# Patient Record
Sex: Female | Born: 1940
Health system: Southern US, Community
[De-identification: ages and names within clinical notes are randomized; demographics above are authoritative.]

## PROBLEM LIST (undated history)

## (undated) DIAGNOSIS — R7303 Prediabetes: Secondary | ICD-10-CM

## (undated) DIAGNOSIS — M1990 Unspecified osteoarthritis, unspecified site: Secondary | ICD-10-CM

## (undated) DIAGNOSIS — I341 Nonrheumatic mitral (valve) prolapse: Secondary | ICD-10-CM

## (undated) DIAGNOSIS — I201 Angina pectoris with documented spasm: Secondary | ICD-10-CM

## (undated) DIAGNOSIS — M797 Fibromyalgia: Secondary | ICD-10-CM

## (undated) DIAGNOSIS — K219 Gastro-esophageal reflux disease without esophagitis: Secondary | ICD-10-CM

## (undated) DIAGNOSIS — R21 Rash and other nonspecific skin eruption: Secondary | ICD-10-CM

## (undated) DIAGNOSIS — H409 Unspecified glaucoma: Secondary | ICD-10-CM

## (undated) HISTORY — PX: SHOULDER ARTHROSCOPY W/ ROTATOR CUFF REPAIR: SHX2400

## (undated) HISTORY — DX: Angina pectoris with documented spasm: I20.1

---

## 1973-03-17 HISTORY — PX: ABDOMINAL HYSTERECTOMY: SHX81

## 1998-03-17 HISTORY — PX: CARDIAC CATHETERIZATION: SHX172

## 1999-01-23 ENCOUNTER — Ambulatory Visit (HOSPITAL_COMMUNITY): Admission: RE | Admit: 1999-01-23 | Discharge: 1999-01-23 | Payer: Self-pay | Admitting: *Deleted

## 2001-03-19 ENCOUNTER — Encounter: Payer: Self-pay | Admitting: Family Medicine

## 2001-03-19 ENCOUNTER — Encounter: Admission: RE | Admit: 2001-03-19 | Discharge: 2001-03-19 | Payer: Self-pay | Admitting: Family Medicine

## 2002-01-14 ENCOUNTER — Ambulatory Visit (HOSPITAL_BASED_OUTPATIENT_CLINIC_OR_DEPARTMENT_OTHER): Admission: RE | Admit: 2002-01-14 | Discharge: 2002-01-14 | Payer: Self-pay | Admitting: Family Medicine

## 2002-09-14 ENCOUNTER — Encounter: Admission: RE | Admit: 2002-09-14 | Discharge: 2002-09-28 | Payer: Self-pay | Admitting: Family Medicine

## 2003-03-18 DIAGNOSIS — I201 Angina pectoris with documented spasm: Secondary | ICD-10-CM

## 2003-03-18 HISTORY — DX: Angina pectoris with documented spasm: I20.1

## 2003-04-18 ENCOUNTER — Inpatient Hospital Stay (HOSPITAL_COMMUNITY): Admission: EM | Admit: 2003-04-18 | Discharge: 2003-04-19 | Payer: Self-pay | Admitting: Emergency Medicine

## 2003-09-01 ENCOUNTER — Encounter: Admission: RE | Admit: 2003-09-01 | Discharge: 2003-09-01 | Payer: Self-pay | Admitting: Orthopedic Surgery

## 2003-09-04 ENCOUNTER — Ambulatory Visit (HOSPITAL_COMMUNITY): Admission: RE | Admit: 2003-09-04 | Discharge: 2003-09-04 | Payer: Self-pay | Admitting: Orthopedic Surgery

## 2003-09-04 ENCOUNTER — Ambulatory Visit (HOSPITAL_BASED_OUTPATIENT_CLINIC_OR_DEPARTMENT_OTHER): Admission: RE | Admit: 2003-09-04 | Discharge: 2003-09-04 | Payer: Self-pay | Admitting: Orthopedic Surgery

## 2006-04-03 ENCOUNTER — Encounter: Admission: RE | Admit: 2006-04-03 | Discharge: 2006-04-03 | Payer: Self-pay | Admitting: *Deleted

## 2008-04-25 ENCOUNTER — Encounter: Admission: RE | Admit: 2008-04-25 | Discharge: 2008-04-25 | Payer: Self-pay | Admitting: Family Medicine

## 2010-04-06 ENCOUNTER — Encounter: Payer: Self-pay | Admitting: Neurological Surgery

## 2010-08-02 NOTE — Discharge Summary (Signed)
NAME:  Patty [name], Patty [name]                         ACCOUNT NO.:  [id number]   MEDICAL RECORD NO.:  [id number]                   PATIENT TYPE:  INP   LOCATION:  3733                                 FACILITY:  [facility]   PHYSICIAN:  Meade Maw, M.D.                 DATE OF BIRTH:  Mar 03, 1941   DATE OF ADMISSION:  04/18/2003  DATE OF DISCHARGE:  04/19/2003                                 DISCHARGE SUMMARY   ADMISSION DIAGNOSES:  1. Chest pain, rule out myocardial infarction.  2. History of right coronary artery spasm.  3. Normal coronaries by cardiac catheterization in 2000.   DISCHARGE DIAGNOSES:  1. Chest pain, negative for myocardial infarction.  2. History of right coronary artery spasm in November of 2000.   PROCEDURES:  Stress Cardiolite on April 19, 2003.   COMPLICATIONS:  None.   DISCHARGE STATUS:  Stable.  Improved.   ADMISSION HISTORY:  Please see completed H&P for details but in short, this  is a 70 year old female who has a history of RCA ostial spasm during cardiac  catheterization, January 23, 1999.  She had no coronary disease by cath.  LV  function was normal with an EF of 77%.  She presented to the emergency room  complaining of right-sided chest pain that woke her from sleep on the  morning of admission around 5 a.m.  She described it as a sharp stinging  sensation and states it was similar to the pain she had experienced  previously before her cardiac catheterization in 2000.  She states, however,  it was somewhat more intense.  She denied any other associated symptoms.  She stated that it only lasted a couple of seconds but had been essentially  intermittent throughout the day on the day of admission.  She went to her  PCP's office and she was given 1 sublingual nitroglycerin, started on O2 and  states her pain was relieved and has not recurred.   PHYSICAL EXAMINATION ON ADMISSION:  Please see complete H&P, but in short,  vital signs were stable.  She was  afebrile.  Physical exam on admission was  essentially normal without any abnormalities.   LABORATORY AND ACCESSORY CLINICAL DATA:  Chest x-ray showed no active  disease.   EKG showed a normal sinus rhythm with a T wave inversion in lead III only,  which was unchanged since her last tracing in 2001.  There were some  nonspecific anterior T wave inversions in the leads V1 through 4 with  biphasic T's and somewhat nonspecific.   Admissions labs were all normal including CBC and coagulation studies and  cardiac markers.   HOSPITAL COURSE:  She was admitted to rule out MI with serial enzymes.  Repeat EKG.  She will be continued on her aspirin.  Lovenox subcu as well as  nitroglycerin paste were also started.  She was scheduled for a stress  Cardiolite the following  morning.  Her calcium channel blocker will be  continued, with her history of RCA spasm.   Repeat cardiac enzymes were all normal.  Her physical exam was unchanged.  She actually refused her Lovenox injection secondary to not quite  understanding its purpose.  She essentially had no further chest discomfort  except for some burning to the general anterior chest wall.  Repeat  cardiac enzymes were normal.  She was scheduled for a stress Cardiolite  later that day.   Stress Cardiolite study was done on April 19, 2003 without incident.  She  had no complaint of chest pain.  Normal blood pressure response.  She had 1-  mm anterior ST depression with exercise that resolved at 3 minutes'  recovery.  There were no other EKG changes.  The results of the Cardiolite  showed no evidence of ischemia and an EF was 76%.  The patient was  discharged home later that day without incident.   DISCHARGE MEDICATIONS:  1. Cartia XT 180 mg daily.  2. Aspirin 325 mg daily.  3. Prozac 40 mg at h.s.  4. Wellbutrin 150 mg daily.  5. Premarin 1.25 mg daily.  6. Prilosec 20 mg daily.  7. Calcium with vitamin D b.i.d.   DIET:  She was  instructed to maintain a low-salt, low-caffeine diet.   FOLLOWUP:  She is to see her regular physician for followup as needed.  She  may see Dr. Meade Maw again on an as-needed basis or at the discretion  of her PCP.      Adrian Saran, N.P.                        Meade Maw, M.D.    HB/MEDQ  D:  05/17/2003  T:  05/18/2003  Job:  [id number]   cc:   Dellis Anes. Idell Pickles, M.D.  [address]  [place]  [place] [address]  Fax: [phone]   Meade Maw, M.D.  [address]  [place]  [place] [address]  Fax: [phone]

## 2010-08-02 NOTE — Cardiovascular Report (Signed)
[place]  Patient:    Patty [name]                         MRN: [id number] Proc. Date: 01/23/99 Adm. Date:  [id number] Attending:  Meade Maw A CC:         Cardiac Catheterization Laboratory                        Cardiac Catheterization  PROCEDURE PERFORMED:  Left heart catheterization, coronary angiography, single plane ventriculogram, intercoronary IV nitroglycerin.  CARDIOLOGIST:  Meade Maw, M.D.  INDICATIONS:  Chest pain with positive stress test.  DESCRIPTION OF PROCEDURE:  After obtaining a written informed consent, the patient was brought to the cardiac catheterization laboratory in a postabsorptive state. Preoperative sedation was achieved using IV Versed, IV fentanyl.  The right femoral head was identified using radiographic technique.  The right groin was prepped nd draped in the usual sterile fashion.  Local anesthesia was achieved using 1% Xylocaine.  A 6-French hemostasis sheath was placed into the right femoral artery using a modified Seldinger technique.  Selective coronary angiography was performed using a JL4 and JR4 Judkins catheter.  Nonionic contrast was used and was hand injected per the coronaries.  There was significant ostial spasm of the right coronary artery.  The JR4 was exchanged to a No-Torque right.  Intercoronary nitroglycerin was repeated.  A single plane ventriculogram was performed in the RAO position using a 6-French pigtail curved catheter.  Nonionic contrast was used nd was power-injected.  Upon review of the films, there was no identifiable lesions to intervene upon.  The hemostasis sheath was removed.  Hemostasis was achieved using digital pressure.  Of note, the patient required a significant amount of sedation.  FINDINGS: Aortic pressure:  140/60. LV pressure:  139/23.  SINGLE PLANE VENTRICULOGRAM:  Revealed normal wall motion.  No mitral regurgitation noted.  CORONARY  ANGIOGRAPHY: 1. Left main coronary artery:  Bifurcated into the left anterior descending    and circumflex vessel.  There was no significant disease in the left main    coronary artery. 2. Left anterior descending coronary artery:  The left anterior descending    gave rise to a large D-I which was bifurcating, a small D-II, and ended as    an epicardial recurrent branch.  There was no significant disease in the    LAD. 3. Circumflex vessel:  The circumflex vessel gave rise to a small OM-I,    moderate OM-II, moderate OM-III, and ended as a small AV groove vessel.    There was no significant disease in the circumflex vessel. 4. Right coronary artery:  Was dominant per the posterior circulation.  There    was significant ostial spasm with engagement of the right coronary artery.    This improved with intercoronary nitroglycerin.  There were two moderate    sized RV marginals, a large PDA, and two PL branches.  There was no    significant disease in the right coronary artery.  IMPRESSION:  Significant ostial spasm of the right coronary artery.  RECOMMENDATIONS:  Cardizem CD 180 mg q.d. and tobacco cessation. DD:  01/23/99 TD:  01/23/99 Job: [id number]

## 2010-08-02 NOTE — Op Note (Signed)
NAME:  Patty [name], Patty [name]                         ACCOUNT NO.:  [id number]   MEDICAL RECORD NO.:  [id number]                   PATIENT TYPE:  AMB   LOCATION:  DSC                                  FACILITY:  [facility]   PHYSICIAN:  Robert [name]. Thurston Hole, M.D.              DATE OF BIRTH:  03/15/1941   DATE OF PROCEDURE:  09/04/2003  DATE OF DISCHARGE:                                 OPERATIVE REPORT   PREOPERATIVE DIAGNOSIS:  Left shoulder rotator cuff tendonitis with  impingement with acromioclavicular joint spurring.   POSTOPERATIVE DIAGNOSIS:  1. Left shoulder partial rotator cuff tear.  2. Left shoulder partial labral tear.  3. Left shoulder impingement.  4. Left shoulder acromioclavicular joint spurring and degenerative joint     disease.   PROCEDURE:  1. Left shoulder examination under anesthesia followed by arthroscopic     debridement of partial rotator cuff tear and partial labral tear. 2.     Left shoulder subacromial decompression.  2. Left shoulder distal clavicle excision.   SURGEON:  Elana Alm. Thurston Hole, M.D.   ASSISTANT:  Julien Girt, P.[name].   ANESTHESIA:  General.   OPERATIVE TIME:  40 minutes.   COMPLICATIONS:  None.   INDICATIONS FOR PROCEDURE:  [name] is [name] 70 year old woman who has had  significant left shoulder pain for the past nine months increasing in nature  with exam and MRI documenting rotator cuff tendonitis and impingement with  [facility] joint spurring who has failed conservative care and is now to undergo  arthroscopy.   DESCRIPTION OF PROCEDURE:  [name] is brought to the operating room on  September 04, 2003, after an interscalene block had been placed in the holding  room by anesthesia.  She was placed on the operating table in supine  position.  Her left shoulder was examined under anesthesia.  She had full  range of motion and her shoulder was stable ligamentous exam.  She was then  placed in [name] beach chair position and her shoulder and arm were  prepped using  sterile DuraPrep and draped using sterile technique.  Originally, through [name]  posterior arthroscopic portal, the arthroscope with [name] pump attached was  placed and through an anterior portal, an arthroscopic probe was placed.  On  initial inspection, the articular cartilage in the glenohumeral joint showed  mild grade 1-2 chondromalacia.  The anterior labrum partial tearing of 25%  was debrided.  The superior labrum and biceps tendon anchor was intact.  The  biceps tendon was intact.  The inferior labrum and anterior inferior  glenohumeral ligament complex was intact.  The posterior labrum was intact.  The rotator cuff showed [name] partial tear 30% of the under surface of the  supraspinatus which was debrided.  The rest of the rotator cuff was intact.  The inferior capsular recess was free of pathology.  The subacromial space  showed [name] large amount of bursitis  which  was resected.  The rotator cuff was  somewhat inflamed and thickened on the bursal side but no evidence of [name]  tear.  Impingement was noted and [name] subacromial decompression was carried out  as well as [name] CA ligament release removing 6-8 mm of the under surface of the  anterior, anterolateral, and anteromedial acromion.  The [facility] joint showed  significant spurring and degenerative changes and the distal 5-6 mm of  clavicle was resected.  After this was done, the shoulder could be brought  through [name] [name] full range of motion with no impingement on the rotator cuff.  At this point, it was felt that all the pathology had been satisfactorily  addressed.  The instruments were removed.  The portals were closed with 3-0  nylon sutures.  Sterile dressings and [name] sling were applied.  The patient was  awakened and taken to the recovery room in stable condition.   FOLLOW UP CARE:  [name] will be followed as an outpatient on Vicodin and  Naprosyn with early physical therapy.  See her back in the office in [name] week  for sutures out and  follow up.                                               Robert [name]. Thurston Hole, M.D.    RAW/MEDQ  D:  09/04/2003  T:  09/04/2003  Job:  [id number]

## 2010-09-10 ENCOUNTER — Other Ambulatory Visit: Payer: Self-pay | Admitting: Family Medicine

## 2010-09-10 DIAGNOSIS — N644 Mastodynia: Secondary | ICD-10-CM

## 2010-09-17 ENCOUNTER — Ambulatory Visit
Admission: RE | Admit: 2010-09-17 | Discharge: 2010-09-17 | Disposition: A | Discharge status: Home / Self Care | Payer: Medicare Other | Source: Ambulatory Visit | Attending: Family Medicine | Admitting: Family Medicine

## 2010-09-17 DIAGNOSIS — N644 Mastodynia: Secondary | ICD-10-CM

## 2012-01-12 ENCOUNTER — Other Ambulatory Visit: Payer: Self-pay | Admitting: Family Medicine

## 2012-01-12 DIAGNOSIS — Z1231 Encounter for screening mammogram for malignant neoplasm of breast: Secondary | ICD-10-CM

## 2012-02-09 ENCOUNTER — Ambulatory Visit
Admission: RE | Admit: 2012-02-09 | Discharge: 2012-02-09 | Disposition: A | Discharge status: Home / Self Care | Payer: Medicare PPO | Source: Ambulatory Visit | Attending: Family Medicine | Admitting: Family Medicine

## 2012-02-09 DIAGNOSIS — Z1231 Encounter for screening mammogram for malignant neoplasm of breast: Secondary | ICD-10-CM

## 2014-03-30 ENCOUNTER — Other Ambulatory Visit: Payer: Self-pay | Admitting: Family Medicine

## 2014-03-30 DIAGNOSIS — E669 Obesity, unspecified: Secondary | ICD-10-CM | POA: Diagnosis not present

## 2014-03-30 DIAGNOSIS — K219 Gastro-esophageal reflux disease without esophagitis: Secondary | ICD-10-CM | POA: Diagnosis not present

## 2014-03-30 DIAGNOSIS — Z713 Dietary counseling and surveillance: Secondary | ICD-10-CM | POA: Diagnosis not present

## 2014-03-30 DIAGNOSIS — G4700 Insomnia, unspecified: Secondary | ICD-10-CM | POA: Diagnosis not present

## 2014-03-30 DIAGNOSIS — M8580 Other specified disorders of bone density and structure, unspecified site: Secondary | ICD-10-CM

## 2014-03-30 DIAGNOSIS — Z1231 Encounter for screening mammogram for malignant neoplasm of breast: Secondary | ICD-10-CM

## 2014-03-30 DIAGNOSIS — I201 Angina pectoris with documented spasm: Secondary | ICD-10-CM | POA: Diagnosis not present

## 2014-04-11 DIAGNOSIS — L299 Pruritus, unspecified: Secondary | ICD-10-CM | POA: Diagnosis not present

## 2014-04-11 DIAGNOSIS — B079 Viral wart, unspecified: Secondary | ICD-10-CM | POA: Diagnosis not present

## 2014-04-11 DIAGNOSIS — L928 Other granulomatous disorders of the skin and subcutaneous tissue: Secondary | ICD-10-CM | POA: Diagnosis not present

## 2014-04-11 DIAGNOSIS — G4700 Insomnia, unspecified: Secondary | ICD-10-CM | POA: Diagnosis not present

## 2014-04-25 ENCOUNTER — Ambulatory Visit
Admission: RE | Admit: 2014-04-25 | Discharge: 2014-04-25 | Disposition: A | Discharge status: Home / Self Care | Payer: Commercial Managed Care - HMO | Source: Ambulatory Visit | Attending: Family Medicine | Admitting: Family Medicine

## 2014-04-25 DIAGNOSIS — Z780 Asymptomatic menopausal state: Secondary | ICD-10-CM | POA: Diagnosis not present

## 2014-04-25 DIAGNOSIS — Z1231 Encounter for screening mammogram for malignant neoplasm of breast: Secondary | ICD-10-CM | POA: Diagnosis not present

## 2014-04-25 DIAGNOSIS — M8580 Other specified disorders of bone density and structure, unspecified site: Secondary | ICD-10-CM

## 2014-04-25 DIAGNOSIS — M85852 Other specified disorders of bone density and structure, left thigh: Secondary | ICD-10-CM | POA: Diagnosis not present

## 2014-04-25 DIAGNOSIS — M8588 Other specified disorders of bone density and structure, other site: Secondary | ICD-10-CM | POA: Diagnosis not present

## 2014-07-31 DIAGNOSIS — R21 Rash and other nonspecific skin eruption: Secondary | ICD-10-CM | POA: Diagnosis not present

## 2014-08-22 DIAGNOSIS — R21 Rash and other nonspecific skin eruption: Secondary | ICD-10-CM | POA: Diagnosis not present

## 2014-10-11 DIAGNOSIS — L562 Photocontact dermatitis [berloque dermatitis]: Secondary | ICD-10-CM | POA: Diagnosis not present

## 2014-11-07 DIAGNOSIS — Z0100 Encounter for examination of eyes and vision without abnormal findings: Secondary | ICD-10-CM | POA: Diagnosis not present

## 2014-11-24 DIAGNOSIS — T148 Other injury of unspecified body region: Secondary | ICD-10-CM | POA: Diagnosis not present

## 2014-11-24 DIAGNOSIS — J069 Acute upper respiratory infection, unspecified: Secondary | ICD-10-CM | POA: Diagnosis not present

## 2014-11-24 DIAGNOSIS — J029 Acute pharyngitis, unspecified: Secondary | ICD-10-CM | POA: Diagnosis not present

## 2014-12-26 DIAGNOSIS — H40023 Open angle with borderline findings, high risk, bilateral: Secondary | ICD-10-CM | POA: Diagnosis not present

## 2014-12-26 DIAGNOSIS — H527 Unspecified disorder of refraction: Secondary | ICD-10-CM | POA: Diagnosis not present

## 2014-12-26 DIAGNOSIS — H25813 Combined forms of age-related cataract, bilateral: Secondary | ICD-10-CM | POA: Diagnosis not present

## 2014-12-26 DIAGNOSIS — H5213 Myopia, bilateral: Secondary | ICD-10-CM | POA: Diagnosis not present

## 2015-02-28 DIAGNOSIS — M5412 Radiculopathy, cervical region: Secondary | ICD-10-CM | POA: Diagnosis not present

## 2015-02-28 DIAGNOSIS — M79673 Pain in unspecified foot: Secondary | ICD-10-CM | POA: Diagnosis not present

## 2015-04-03 DIAGNOSIS — K625 Hemorrhage of anus and rectum: Secondary | ICD-10-CM | POA: Diagnosis not present

## 2015-04-03 DIAGNOSIS — R42 Dizziness and giddiness: Secondary | ICD-10-CM | POA: Diagnosis not present

## 2015-04-03 DIAGNOSIS — K648 Other hemorrhoids: Secondary | ICD-10-CM | POA: Diagnosis not present

## 2015-05-10 DIAGNOSIS — D72829 Elevated white blood cell count, unspecified: Secondary | ICD-10-CM | POA: Diagnosis not present

## 2015-05-10 DIAGNOSIS — M25511 Pain in right shoulder: Secondary | ICD-10-CM | POA: Diagnosis not present

## 2015-05-23 DIAGNOSIS — M67911 Unspecified disorder of synovium and tendon, right shoulder: Secondary | ICD-10-CM | POA: Diagnosis not present

## 2015-05-23 DIAGNOSIS — M25511 Pain in right shoulder: Secondary | ICD-10-CM | POA: Diagnosis not present

## 2015-05-23 DIAGNOSIS — M24811 Other specific joint derangements of right shoulder, not elsewhere classified: Secondary | ICD-10-CM | POA: Diagnosis not present

## 2015-06-06 ENCOUNTER — Ambulatory Visit: Payer: Commercial Managed Care - HMO | Admitting: Oncology

## 2015-06-13 DIAGNOSIS — M67911 Unspecified disorder of synovium and tendon, right shoulder: Secondary | ICD-10-CM | POA: Diagnosis not present

## 2015-06-25 ENCOUNTER — Other Ambulatory Visit: Payer: Self-pay | Admitting: Orthopedic Surgery

## 2015-06-25 DIAGNOSIS — M67911 Unspecified disorder of synovium and tendon, right shoulder: Secondary | ICD-10-CM

## 2015-06-26 DIAGNOSIS — R509 Fever, unspecified: Secondary | ICD-10-CM | POA: Diagnosis not present

## 2015-06-26 DIAGNOSIS — R5383 Other fatigue: Secondary | ICD-10-CM | POA: Diagnosis not present

## 2015-06-26 DIAGNOSIS — R109 Unspecified abdominal pain: Secondary | ICD-10-CM | POA: Diagnosis not present

## 2015-06-27 DIAGNOSIS — H40023 Open angle with borderline findings, high risk, bilateral: Secondary | ICD-10-CM | POA: Diagnosis not present

## 2015-06-30 ENCOUNTER — Other Ambulatory Visit: Payer: Commercial Managed Care - HMO

## 2015-07-11 ENCOUNTER — Ambulatory Visit
Admission: RE | Admit: 2015-07-11 | Discharge: 2015-07-11 | Disposition: A | Discharge status: Home / Self Care | Payer: Commercial Managed Care - HMO | Source: Ambulatory Visit | Attending: Orthopedic Surgery | Admitting: Orthopedic Surgery

## 2015-07-11 DIAGNOSIS — M67911 Unspecified disorder of synovium and tendon, right shoulder: Secondary | ICD-10-CM

## 2015-07-11 DIAGNOSIS — M75111 Incomplete rotator cuff tear or rupture of right shoulder, not specified as traumatic: Secondary | ICD-10-CM | POA: Diagnosis not present

## 2015-07-13 DIAGNOSIS — M75121 Complete rotator cuff tear or rupture of right shoulder, not specified as traumatic: Secondary | ICD-10-CM | POA: Diagnosis not present

## 2015-09-25 ENCOUNTER — Other Ambulatory Visit: Payer: Self-pay | Admitting: Orthopedic Surgery

## 2015-10-05 DIAGNOSIS — R1013 Epigastric pain: Secondary | ICD-10-CM | POA: Diagnosis not present

## 2015-10-05 DIAGNOSIS — M25569 Pain in unspecified knee: Secondary | ICD-10-CM | POA: Diagnosis not present

## 2015-10-05 DIAGNOSIS — R110 Nausea: Secondary | ICD-10-CM | POA: Diagnosis not present

## 2015-10-05 DIAGNOSIS — M791 Myalgia: Secondary | ICD-10-CM | POA: Diagnosis not present

## 2015-10-05 DIAGNOSIS — E162 Hypoglycemia, unspecified: Secondary | ICD-10-CM | POA: Diagnosis not present

## 2015-10-08 ENCOUNTER — Other Ambulatory Visit: Payer: Self-pay | Admitting: Physician Assistant

## 2015-10-08 DIAGNOSIS — R1013 Epigastric pain: Secondary | ICD-10-CM

## 2015-10-08 DIAGNOSIS — R110 Nausea: Secondary | ICD-10-CM

## 2015-10-16 ENCOUNTER — Encounter (HOSPITAL_BASED_OUTPATIENT_CLINIC_OR_DEPARTMENT_OTHER): Payer: Self-pay | Admitting: *Deleted

## 2015-10-16 DIAGNOSIS — L309 Dermatitis, unspecified: Secondary | ICD-10-CM | POA: Diagnosis not present

## 2015-10-17 ENCOUNTER — Encounter (HOSPITAL_BASED_OUTPATIENT_CLINIC_OR_DEPARTMENT_OTHER)
Admission: RE | Admit: 2015-10-17 | Discharge: 2015-10-17 | Disposition: A | Discharge status: Home / Self Care | Payer: Commercial Managed Care - HMO | Source: Ambulatory Visit | Attending: Orthopedic Surgery | Admitting: Orthopedic Surgery

## 2015-10-17 DIAGNOSIS — Z01810 Encounter for preprocedural cardiovascular examination: Secondary | ICD-10-CM | POA: Insufficient documentation

## 2015-10-17 DIAGNOSIS — R9431 Abnormal electrocardiogram [ECG] [EKG]: Secondary | ICD-10-CM | POA: Insufficient documentation

## 2015-10-21 DIAGNOSIS — H00011 Hordeolum externum right upper eyelid: Secondary | ICD-10-CM | POA: Diagnosis not present

## 2015-10-22 ENCOUNTER — Ambulatory Visit (HOSPITAL_BASED_OUTPATIENT_CLINIC_OR_DEPARTMENT_OTHER)
Admission: RE | Admit: 2015-10-22 | Discharge: 2015-10-22 | Disposition: A | Discharge status: Home / Self Care | Payer: Commercial Managed Care - HMO | Source: Ambulatory Visit | Attending: Orthopedic Surgery | Admitting: Orthopedic Surgery

## 2015-10-22 ENCOUNTER — Ambulatory Visit (HOSPITAL_BASED_OUTPATIENT_CLINIC_OR_DEPARTMENT_OTHER): Payer: Commercial Managed Care - HMO | Admitting: Anesthesiology

## 2015-10-22 ENCOUNTER — Encounter (HOSPITAL_BASED_OUTPATIENT_CLINIC_OR_DEPARTMENT_OTHER): Payer: Self-pay | Admitting: Anesthesiology

## 2015-10-22 ENCOUNTER — Encounter (HOSPITAL_BASED_OUTPATIENT_CLINIC_OR_DEPARTMENT_OTHER)
Admission: RE | Disposition: A | Discharge status: Home / Self Care | Payer: Self-pay | Source: Ambulatory Visit | Attending: Orthopedic Surgery

## 2015-10-22 DIAGNOSIS — R7303 Prediabetes: Secondary | ICD-10-CM | POA: Insufficient documentation

## 2015-10-22 DIAGNOSIS — M75121 Complete rotator cuff tear or rupture of right shoulder, not specified as traumatic: Secondary | ICD-10-CM | POA: Diagnosis not present

## 2015-10-22 DIAGNOSIS — Z79890 Hormone replacement therapy (postmenopausal): Secondary | ICD-10-CM | POA: Insufficient documentation

## 2015-10-22 DIAGNOSIS — E669 Obesity, unspecified: Secondary | ICD-10-CM | POA: Diagnosis not present

## 2015-10-22 DIAGNOSIS — S46111A Strain of muscle, fascia and tendon of long head of biceps, right arm, initial encounter: Secondary | ICD-10-CM | POA: Diagnosis not present

## 2015-10-22 DIAGNOSIS — M19011 Primary osteoarthritis, right shoulder: Secondary | ICD-10-CM | POA: Diagnosis not present

## 2015-10-22 DIAGNOSIS — M75101 Unspecified rotator cuff tear or rupture of right shoulder, not specified as traumatic: Secondary | ICD-10-CM | POA: Diagnosis not present

## 2015-10-22 DIAGNOSIS — Z6833 Body mass index (BMI) 33.0-33.9, adult: Secondary | ICD-10-CM | POA: Diagnosis not present

## 2015-10-22 DIAGNOSIS — I2510 Atherosclerotic heart disease of native coronary artery without angina pectoris: Secondary | ICD-10-CM | POA: Diagnosis not present

## 2015-10-22 DIAGNOSIS — X58XXXA Exposure to other specified factors, initial encounter: Secondary | ICD-10-CM | POA: Diagnosis not present

## 2015-10-22 DIAGNOSIS — K219 Gastro-esophageal reflux disease without esophagitis: Secondary | ICD-10-CM | POA: Insufficient documentation

## 2015-10-22 DIAGNOSIS — M66321 Spontaneous rupture of flexor tendons, right upper arm: Secondary | ICD-10-CM | POA: Diagnosis not present

## 2015-10-22 DIAGNOSIS — Z79899 Other long term (current) drug therapy: Secondary | ICD-10-CM | POA: Diagnosis not present

## 2015-10-22 DIAGNOSIS — G8918 Other acute postprocedural pain: Secondary | ICD-10-CM | POA: Diagnosis not present

## 2015-10-22 DIAGNOSIS — M25511 Pain in right shoulder: Secondary | ICD-10-CM | POA: Diagnosis not present

## 2015-10-22 HISTORY — DX: Unspecified osteoarthritis, unspecified site: M19.90

## 2015-10-22 HISTORY — DX: Rash and other nonspecific skin eruption: R21

## 2015-10-22 HISTORY — DX: Gastro-esophageal reflux disease without esophagitis: K21.9

## 2015-10-22 HISTORY — DX: Unspecified glaucoma: H40.9

## 2015-10-22 HISTORY — DX: Nonrheumatic mitral (valve) prolapse: I34.1

## 2015-10-22 HISTORY — DX: Fibromyalgia: M79.7

## 2015-10-22 HISTORY — DX: Prediabetes: R73.03

## 2015-10-22 SURGERY — SHOULDER ARTHROSCOPY WITH SUBACROMIAL DECOMPRESSION AND DISTAL CLAVICLE EXCISION
Anesthesia: General | Site: Shoulder | Laterality: Right

## 2015-10-22 MED ORDER — SCOPOLAMINE 1 MG/3DAYS TD PT72: 1.0000 | MEDICATED_PATCH | Freq: Once | TRANSDERMAL | Status: DC | PRN

## 2015-10-22 MED ORDER — MIDAZOLAM HCL 2 MG/2ML IJ SOLN
INTRAMUSCULAR | Status: AC
  Filled 2015-10-22: qty 2

## 2015-10-22 MED ORDER — CEFAZOLIN SODIUM-DEXTROSE 2-4 GM/100ML-% IV SOLN
2.0000 g | INTRAVENOUS | Status: AC
  Administered 2015-10-22: 2 g via INTRAVENOUS

## 2015-10-22 MED ORDER — FENTANYL CITRATE (PF) 100 MCG/2ML IJ SOLN
50.0000 ug | INTRAMUSCULAR | Status: AC | PRN
  Administered 2015-10-22 (×3): 50 ug via INTRAVENOUS

## 2015-10-22 MED ORDER — POVIDONE-IODINE 7.5 % EX SOLN: Freq: Once | CUTANEOUS | Status: DC

## 2015-10-22 MED ORDER — FENTANYL CITRATE (PF) 100 MCG/2ML IJ SOLN
INTRAMUSCULAR | Status: AC
  Filled 2015-10-22: qty 2

## 2015-10-22 MED ORDER — DOCUSATE SODIUM 100 MG PO CAPS: 100.0000 mg | ORAL_CAPSULE | Freq: Three times a day (TID) | ORAL | 0 refills | Status: DC | PRN

## 2015-10-22 MED ORDER — FENTANYL CITRATE (PF) 100 MCG/2ML IJ SOLN: 25.0000 ug | INTRAMUSCULAR | Status: DC | PRN

## 2015-10-22 MED ORDER — SODIUM CHLORIDE 0.9 % IR SOLN
Status: DC | PRN
  Administered 2015-10-22: 5000 mL

## 2015-10-22 MED ORDER — DEXAMETHASONE SODIUM PHOSPHATE 4 MG/ML IJ SOLN
INTRAMUSCULAR | Status: DC | PRN
  Administered 2015-10-22: 8 mg via INTRAVENOUS

## 2015-10-22 MED ORDER — LIDOCAINE HCL (CARDIAC) 20 MG/ML IV SOLN
INTRAVENOUS | Status: DC | PRN
  Administered 2015-10-22: 50 mg via INTRAVENOUS

## 2015-10-22 MED ORDER — PROPOFOL 10 MG/ML IV BOLUS
INTRAVENOUS | Status: DC | PRN
  Administered 2015-10-22: 15 mg via INTRAVENOUS

## 2015-10-22 MED ORDER — SUCCINYLCHOLINE CHLORIDE 20 MG/ML IJ SOLN
INTRAMUSCULAR | Status: DC | PRN
  Administered 2015-10-22: 140 mg via INTRAVENOUS

## 2015-10-22 MED ORDER — OXYCODONE-ACETAMINOPHEN 5-325 MG PO TABS: 1.0000 | ORAL_TABLET | ORAL | 0 refills | Status: DC | PRN

## 2015-10-22 MED ORDER — GLYCOPYRROLATE 0.2 MG/ML IJ SOLN: 0.2000 mg | Freq: Once | INTRAMUSCULAR | Status: DC | PRN

## 2015-10-22 MED ORDER — CEFAZOLIN SODIUM-DEXTROSE 2-4 GM/100ML-% IV SOLN
INTRAVENOUS | Status: AC
  Filled 2015-10-22: qty 100

## 2015-10-22 MED ORDER — MIDAZOLAM HCL 2 MG/2ML IJ SOLN
1.0000 mg | INTRAMUSCULAR | Status: DC | PRN
  Administered 2015-10-22 (×3): 1 mg via INTRAVENOUS

## 2015-10-22 MED ORDER — LACTATED RINGERS IV SOLN
INTRAVENOUS | Status: DC
  Administered 2015-10-22: 12:00:00 via INTRAVENOUS

## 2015-10-22 MED ORDER — PHENYLEPHRINE HCL 10 MG/ML IJ SOLN
INTRAVENOUS | Status: DC | PRN
  Administered 2015-10-22: 40 ug/min via INTRAVENOUS

## 2015-10-22 MED ORDER — ONDANSETRON HCL 4 MG/2ML IJ SOLN
INTRAMUSCULAR | Status: DC | PRN
  Administered 2015-10-22: 4 mg via INTRAVENOUS

## 2015-10-22 MED ORDER — PROMETHAZINE HCL 25 MG/ML IJ SOLN: 6.2500 mg | INTRAMUSCULAR | Status: DC | PRN

## 2015-10-22 SURGICAL SUPPLY — 85 items
APL SKNCLS STERI-STRIP NONHPOA (GAUZE/BANDAGES/DRESSINGS)
BENZOIN TINCTURE PRP APPL 2/3 (GAUZE/BANDAGES/DRESSINGS) IMPLANT
BLADE CLIPPER SURG (BLADE) IMPLANT
BLADE SURG 15 STRL LF DISP TIS (BLADE) IMPLANT
BLADE SURG 15 STRL SS (BLADE)
BUR OVAL 4.0 (BURR) ×4 IMPLANT
CANNULA 5.75X71 LONG (CANNULA) ×4 IMPLANT
CANNULA TWIST IN 8.25X7CM (CANNULA) IMPLANT
CHLORAPREP W/TINT 26ML (MISCELLANEOUS) ×4 IMPLANT
CLOSURE WOUND 1/2 X4 (GAUZE/BANDAGES/DRESSINGS)
DECANTER SPIKE VIAL GLASS SM (MISCELLANEOUS) IMPLANT
DRAPE IMP U-DRAPE 54X76 (DRAPES) ×4 IMPLANT
DRAPE INCISE IOBAN 66X45 STRL (DRAPES) ×4 IMPLANT
DRAPE STERI 35X30 U-POUCH (DRAPES) ×4 IMPLANT
DRAPE SURG 17X23 STRL (DRAPES) ×4 IMPLANT
DRAPE U-SHAPE 47X51 STRL (DRAPES) ×4 IMPLANT
DRAPE U-SHAPE 76X120 STRL (DRAPES) ×8 IMPLANT
DRSG PAD ABDOMINAL 8X10 ST (GAUZE/BANDAGES/DRESSINGS) ×4 IMPLANT
ELECT REM PT RETURN 9FT ADLT (ELECTROSURGICAL)
ELECTRODE REM PT RTRN 9FT ADLT (ELECTROSURGICAL) ×1 IMPLANT
GAUZE SPONGE 4X4 12PLY STRL (GAUZE/BANDAGES/DRESSINGS) ×4 IMPLANT
GAUZE SPONGE 4X4 16PLY XRAY LF (GAUZE/BANDAGES/DRESSINGS) IMPLANT
GAUZE XEROFORM 1X8 LF (GAUZE/BANDAGES/DRESSINGS) ×4 IMPLANT
GLOVE BIO SURGEON STRL SZ7 (GLOVE) ×4 IMPLANT
GLOVE BIO SURGEON STRL SZ7.5 (GLOVE) ×4 IMPLANT
GLOVE BIOGEL PI IND STRL 7.0 (GLOVE) ×4 IMPLANT
GLOVE BIOGEL PI IND STRL 8 (GLOVE) ×2 IMPLANT
GLOVE BIOGEL PI INDICATOR 7.0 (GLOVE) ×6
GLOVE BIOGEL PI INDICATOR 8 (GLOVE) ×2
GLOVE ECLIPSE 6.5 STRL STRAW (GLOVE) ×3 IMPLANT
GOWN STRL REUS W/ TWL LRG LVL3 (GOWN DISPOSABLE) ×4 IMPLANT
GOWN STRL REUS W/ TWL XL LVL3 (GOWN DISPOSABLE) ×2 IMPLANT
GOWN STRL REUS W/TWL LRG LVL3 (GOWN DISPOSABLE) ×8
GOWN STRL REUS W/TWL XL LVL3 (GOWN DISPOSABLE) ×4
IV NS IRRIG 3000ML ARTHROMATIC (IV SOLUTION) ×6 IMPLANT
LASSO CRESCENT QUICKPASS (SUTURE) IMPLANT
LIQUID BAND (GAUZE/BANDAGES/DRESSINGS) IMPLANT
MANIFOLD NEPTUNE II (INSTRUMENTS) ×4 IMPLANT
NDL 1/2 CIR CATGUT .05X1.09 (NEEDLE) IMPLANT
NDL SCORPION MULTI FIRE (NEEDLE) IMPLANT
NDL SUT 6 .5 CRC .975X.05 MAYO (NEEDLE) IMPLANT
NEEDLE 1/2 CIR CATGUT .05X1.09 (NEEDLE) IMPLANT
NEEDLE MAYO TAPER (NEEDLE)
NEEDLE SCORPION MULTI FIRE (NEEDLE) IMPLANT
NS IRRIG 1000ML POUR BTL (IV SOLUTION) IMPLANT
PACK ARTHROSCOPY DSU (CUSTOM PROCEDURE TRAY) ×4 IMPLANT
PACK BASIN DAY SURGERY FS (CUSTOM PROCEDURE TRAY) ×4 IMPLANT
PENCIL BUTTON HOLSTER BLD 10FT (ELECTRODE) IMPLANT
RESECTOR FULL RADIUS 4.2MM (BLADE) ×4 IMPLANT
SHEET MEDIUM DRAPE 40X70 STRL (DRAPES) IMPLANT
SLEEVE SCD COMPRESS KNEE MED (MISCELLANEOUS) ×4 IMPLANT
SLING ARM FOAM STRAP LRG (SOFTGOODS) ×3 IMPLANT
SLING ARM IMMOBILIZER MED (SOFTGOODS) IMPLANT
SLING ARM MED ADULT FOAM STRAP (SOFTGOODS) IMPLANT
SLING ARM XL FOAM STRAP (SOFTGOODS) IMPLANT
SPONGE LAP 4X18 X RAY DECT (DISPOSABLE) IMPLANT
STRIP CLOSURE SKIN 1/2X4 (GAUZE/BANDAGES/DRESSINGS) IMPLANT
SUCTION FRAZIER HANDLE 10FR (MISCELLANEOUS)
SUCTION TUBE FRAZIER 10FR DISP (MISCELLANEOUS) IMPLANT
SUPPORT WRAP ARM LG (MISCELLANEOUS) IMPLANT
SUT BONE WAX W31G (SUTURE) IMPLANT
SUT ETHIBOND 2 OS 4 DA (SUTURE) IMPLANT
SUT ETHILON 3 0 PS 1 (SUTURE) ×4 IMPLANT
SUT ETHILON 4 0 PS 2 18 (SUTURE) IMPLANT
SUT FIBERWIRE #2 38 T-5 BLUE (SUTURE)
SUT MNCRL AB 3-0 PS2 18 (SUTURE) IMPLANT
SUT MNCRL AB 4-0 PS2 18 (SUTURE) IMPLANT
SUT PDS AB 0 CT 36 (SUTURE) IMPLANT
SUT PROLENE 3 0 PS 2 (SUTURE) IMPLANT
SUT TIGER TAPE 7 IN WHITE (SUTURE) IMPLANT
SUT VIC AB 0 CT1 27 (SUTURE)
SUT VIC AB 0 CT1 27XBRD ANBCTR (SUTURE) IMPLANT
SUT VIC AB 2-0 SH 27 (SUTURE)
SUT VIC AB 2-0 SH 27XBRD (SUTURE) IMPLANT
SUTURE FIBERWR #2 38 T-5 BLUE (SUTURE) IMPLANT
SYR BULB 3OZ (MISCELLANEOUS) IMPLANT
TAPE FIBER 2MM 7IN #2 BLUE (SUTURE) IMPLANT
TOWEL OR 17X24 6PK STRL BLUE (TOWEL DISPOSABLE) ×4 IMPLANT
TOWEL OR NON WOVEN STRL DISP B (DISPOSABLE) ×4 IMPLANT
TUBE CONNECTING 20'X1/4 (TUBING) ×1
TUBE CONNECTING 20X1/4 (TUBING) ×3 IMPLANT
TUBING ARTHROSCOPY IRRIG 16FT (MISCELLANEOUS) ×4 IMPLANT
WAND STAR VAC 90 (SURGICAL WAND) ×4 IMPLANT
WATER STERILE IRR 1000ML POUR (IV SOLUTION) ×4 IMPLANT
YANKAUER SUCT BULB TIP NO VENT (SUCTIONS) IMPLANT

## 2015-10-22 NOTE — Op Note (Signed)
Procedure(s):   FALANA LANE female 75 y.o. 10/22/2015  Procedure(s) and Anesthesia Type: #1 right shoulder arthroscopic debridement of irreparable rotator cuff tear, proximal biceps tear #2 right shoulder arthroscopic subacromial decompression #3 right shoulder arthroscopic distal clavicle excision  Surgeon(s) and Role:    * Jones Broom, MD - Primary     Surgeon: Mable Paris   Assistants: Damita Lack PA-C (Danielle was present and scrubbed throughout the procedure and was essential in positioning, assisting with the camera and instrumentation,, and closure)  Anesthesia: General endotracheal anesthesia with preoperative interscalene block given by the attending anesthesiologist    Procedure Detail   Estimated Blood Loss: Min         Drains: none  Blood Given: none         Specimens: none        Complications:  * No complications entered in OR log *         Disposition: PACU - hemodynamically stable.         Condition: stable    Procedure:   INDICATIONS FOR SURGERY: The patient is 75 y.o. female who has had a long history of right shoulder pain which has been refractory to nonoperative management. She was found to have a chronic retracted rotator cuff tear with significant acromioclavicular arthropathy. Ultimately indicated for surgical treatment to try and decrease pain and restore function.  OPERATIVE FINDINGS: Examination under anesthesia: No stiffness or instability   DESCRIPTION OF PROCEDURE: The patient was identified in preoperative  holding area where I personally marked the operative site after  verifying site, side, and procedure with the patient. An interscalene block was given by the attending anesthesiologist the holding area.  The patient was taken back to the operating room where general anesthesia was induced without complication and was placed in the beach-chair position with the back  elevated about 60 degrees and all  extremities and head and neck carefully padded and  positioned.   The right upper extremity was then prepped and  draped in a standard sterile fashion. The appropriate time-out  procedure was carried out. The patient did receive IV antibiotics  within 30 minutes of incision.   A small posterior portal incision was made and the arthroscope was introduced into the joint. An anterior portal was then established above the subscapularis using needle localization. Small cannula was placed anteriorly. Diagnostic arthroscopy was then carried out.  She was noted to have retracted tears of the supraspinatus and subscapularis. On the glenoid humeral joint surfaces there was some chondromalacia with a small area of grade 4 exposed bone on the glenoid and a central area of grade 3 on ablation on the humeral head. This was lightly debrided with shaver. There was some tearing of the anterior and superior labrum with about 1.5 cm of residual proximal long head biceps tendon stump. This was all debrided extensively with the shaver. The undersurface of the rotator cuff was debrided.  The arthroscope was then introduced into the subacromial space a standard lateral portal was established with needle localization. There was some residual tendon remaining on the tuberosity but the torn portion of the supraspinatus was retracted laterally to the level of the glenoid. The remaining tendon on the tuberosity was debrided extensively down to bone to prevent any rubbing in this area with flexion. The tuberosity was also lightly debrided down to bleeding surface creating a smoother surface here. The edge of the supraspinatus was debrided back. The infraspinatus was intact.  The coracoacromial ligament  was taken down off the anterior acromion with the ArthroCare exposing a moderate hooked anterior acromial spur. A high-speed bur was then used through the lateral portal to take down the anterior acromial spur from lateral to medial  in a standard acromioplasty.  The acromioplasty was also viewed from the lateral portal and the bur was used as necessary to ensure that the acromion was completely flat from posterior to anterior.  The distal clavicle was exposed arthroscopically and noted to be severely arthritic. The bur was then moved to an anterior portal position to complete the distal clavicle excision resecting about 8-10 mm of the distal clavicle and a smooth even fashion. This was viewed from anterior and lateral portals and felt to be complete. The shaver was used again in the subacromial space to remove any excess bone dust.  The arthroscopic equipment was removed from the joint and the portals were closed with 3-0 nylon in an interrupted fashion. Sterile dressings were then applied including Xeroform 4 x 4's ABDs and tape. The patient was then allowed to awaken from general anesthesia, placed in a sling, transferred to the stretcher and taken to the recovery room in stable condition.   POSTOPERATIVE PLAN: The patient will be discharged home today and will followup in one week for suture removal and wound check.  We will get her back into therapy right away.

## 2015-10-22 NOTE — Discharge Instructions (Signed)
Discharge Instructions after Arthroscopic Shoulder Surgery ° ° °A sling has been provided for you. You may remove the sling after 72 hours. The sling may be worn for your protection, if you are in a crowd.  °Use ice on the shoulder intermittently over the first 48 hours after surgery.  °Pain medication has been prescribed for you.  °Use your medication liberally over the first 48 hours, and then begin to taper your use. You may take Extra Strength Tylenol or Tylenol only in place of the pain pills. DO NOT take ANY nonsteroidal anti-inflammatory pain medications: Advil, Motrin, Ibuprofen, Aleve, Naproxen, or Naprosyn.  °You may remove your dressing after two days.  °You may shower 5 days after surgery. The incision CANNOT get wet prior to 5 days. Simply allow the water to wash over the site and then pat dry. Do not rub the incision. Make sure your axilla (armpit) is completely dry after showering.  °Take one aspirin a day for 2 weeks after surgery, unless you have an aspirin sensitivity/allergy or asthma.  °Three to 5 times each day you should perform assisted overhead reaching and external rotation (outward turning) exercises with the operative arm. Both exercises should be done with the non-operative arm used as the "therapist arm" while the operative arm remains relaxed. Ten of each exercise should be done three to five times each day. ° ° ° °Overhead reach is helping to lift your stiff arm up as high as it will go. To stretch your overhead reach, lie flat on your back, relax, and grasp the wrist of the tight shoulder with your opposite hand. Using the power in your opposite arm, bring the stiff arm up as far as it is comfortable. Start holding it for ten seconds and then work up to where you can hold it for a count of 30. Breathe slowly and deeply while the arm is moved. Repeat this stretch ten times, trying to help the arm up a little higher each time.  ° ° ° ° ° °External rotation is turning the arm out to  the side while your elbow stays close to your body. External rotation is best stretched while you are lying on your back. Hold a cane, yardstick, broom handle, or dowel in both hands. Bend both elbows to a right angle. Use steady, gentle force from your normal arm to rotate the hand of the stiff shoulder out away from your body. Continue the rotation as far as it will go comfortably, holding it there for a count of 10. Repeat this exercise ten times.  ° ° ° °Please call [phone] during normal business hours or [phone] after hours for any problems. Including the following: ° °- excessive redness of the incisions °- drainage for more than 4 days °- fever of more than 101.5 F ° °*Please note that pain medications will not be refilled after hours or on weekends. ° ° ° °Post Anesthesia Home Care Instructions ° °Activity: °Get plenty of rest for the remainder of the day. A responsible adult should stay with you for 24 hours following the procedure.  °For the next 24 hours, DO NOT: °-Drive a car °-Operate machinery °-Drink alcoholic beverages °-Take any medication unless instructed by your physician °-Make any legal decisions or sign important papers. ° °Meals: °Start with liquid foods such as gelatin or soup. Progress to regular foods as tolerated. Avoid greasy, spicy, heavy foods. If nausea and/or vomiting occur, drink only clear liquids until the nausea and/or vomiting subsides. Call   your physician if vomiting continues. ° °Special Instructions/Symptoms: °Your throat may feel dry or sore from the anesthesia or the breathing tube placed in your throat during surgery. If this causes discomfort, gargle with warm salt water. The discomfort should disappear within 24 hours. ° °If you had a scopolamine patch placed behind your ear for the management of post- operative nausea and/or vomiting: ° °1. The medication in the patch is effective for 72 hours, after which it should be removed.  Wrap patch in a tissue and  discard in the trash. Wash hands thoroughly with soap and water. °2. You may remove the patch earlier than 72 hours if you experience unpleasant side effects which may include dry mouth, dizziness or visual disturbances. °3. Avoid touching the patch. Wash your hands with soap and water after contact with the patch. ° ° °  °Regional Anesthesia Blocks ° °1. Numbness or the inability to move the "blocked" extremity may last from 3-48 hours after placement. The length of time depends on the medication injected and your individual response to the medication. If the numbness is not going away after 48 hours, call your surgeon. ° °2. The extremity that is blocked will need to be protected until the numbness is gone and the  Strength has returned. Because you cannot feel it, you will need to take extra care to avoid injury. Because it may be weak, you may have difficulty moving it or using it. You may not know what position it is in without looking at it while the block is in effect. ° °3. For blocks in the legs and feet, returning to weight bearing and walking needs to be done carefully. You will need to wait until the numbness is entirely gone and the strength has returned. You should be able to move your leg and foot normally before you try and bear weight or walk. You will need someone to be with you when you first try to ensure you do not fall and possibly risk injury. ° °4. Bruising and tenderness at the needle site are common side effects and will resolve in a few days. ° °5. Persistent numbness or new problems with movement should be communicated to the surgeon or the [facility] ([phone])/ [facility] ([phone]). °

## 2015-10-22 NOTE — Anesthesia Procedure Notes (Signed)
Anesthesia Procedure Image    

## 2015-10-22 NOTE — H&P (Signed)
Patty [name] is an 75 y.o. female.   Chief Complaint: Right shoulder pain and dysfunction HPI: 75 year old with greater than 6 month history of right shoulder pain which has been refractory to nonoperative management. She was found on MRI to have a large full-thickness retracted tear of the supraspinatus also involving subscapularis. She failed conservative management with injections. Indicated for surgical treatment to try and decrease pain and restore function.  Past Medical History:  Diagnosis Date  . Arthritis   . Coronary artery spasm (HCC)    takes diltiazem  . Fibromyalgia   . GERD (gastroesophageal reflux disease)   . Glaucoma   . MVP (mitral valve prolapse)   . Prediabetes   . Rash of hands     Past Surgical History:  Procedure Laterality Date  . ABDOMINAL HYSTERECTOMY    . CARDIAC CATHETERIZATION  2000   spasm only  . SHOULDER ARTHROSCOPY W/ ROTATOR CUFF REPAIR Left     History reviewed. No pertinent family history. Social History:  reports that she has never smoked. She has never used smokeless tobacco. She reports that she drinks alcohol. Her drug history is not on file.  Allergies:  Allergies  Allergen Reactions  . Melatonin Nausea Only  . Meloxicam Nausea Only  . Penicillins   . Tramadol Nausea Only    Medications Prior to Admission  Medication Sig Dispense Refill  . B Complex Vitamins (VITAMIN B COMPLEX PO) Take by mouth.    . Cholecalciferol (VITAMIN D3) 1000 units CAPS Take by mouth.    . Coenzyme Q10 (COQ-10) 100 MG CAPS Take by mouth.    . diltiazem (CARDIZEM CD) 180 MG 24 hr capsule Take 180 mg by mouth daily.    Marland Kitchen doxylamine, Sleep, (SLEEP AID) 25 MG tablet Take 25 mg by mouth at bedtime as needed.    Marland Kitchen estradiol (ESTRACE) 0.5 MG tablet Take 0.5 mg by mouth daily.    Marland Kitchen ibuprofen (ADVIL,MOTRIN) 200 MG tablet Take 200 mg by mouth every 6 (six) hours as needed.    . magnesium 30 MG tablet Take 30 mg by mouth 2 (two) times daily.    Marland Kitchen omeprazole  (PRILOSEC) 20 MG capsule Take 20 mg by mouth daily.    . Saccharomyces boulardii (PROBIOTIC) 250 MG CAPS Take by mouth.      No results found for this or any previous visit (from the past 48 hour(s)). No results found.  Review of Systems  All other systems reviewed and are negative.   Height 5\' 3"  (1.6 m), weight 86.2 kg (190 lb). Physical Exam  Constitutional: She is oriented to person, place, and time. She appears well-developed and well-nourished.  HENT:  Head: Atraumatic.  Eyes: EOM are normal.  Cardiovascular: Intact distal pulses.   Respiratory: Effort normal.  Musculoskeletal:  R shoulder pain with RC testing, TTP over AC joint.  Neurological: She is alert and oriented to person, place, and time.  Skin: Skin is warm and dry.  Psychiatric: She has a normal mood and affect.     Assessment/Plan 75 year old female with large retracted rotator cuff tear and before meals joint arthropathy Plan right shoulder arthroscopic debridement versus less likely repair rotator cuff tear and subacromial decompression, distal clavicle excision. Risks / benefits of surgery discussed Consent on chart  NPO for OR Preop antibiotics   Mable Paris, MD 10/22/2015, 12:51 PM

## 2015-10-22 NOTE — Progress Notes (Signed)
Assisted Dr. Denenny with right, ultrasound guided, interscalene  block. Side rails up, monitors on throughout procedure. See vital signs in flow sheet. Tolerated Procedure well. 

## 2015-10-22 NOTE — Anesthesia Postprocedure Evaluation (Signed)
Anesthesia Post Note  Patient: Patty [name]  Procedure(s) Performed: Procedure(s) (LRB): SHOULDER ARTHROSCOPYWITH  DEBRIDEMENT,  SUBACROMIAL DECOMPRESSION AND DISTAL CLAVICAL EXCISION (Right)  Patient location during evaluation: PACU Anesthesia Type: General and Regional Level of consciousness: awake and alert Pain management: pain level controlled Vital Signs Assessment: post-procedure vital signs reviewed and stable Respiratory status: spontaneous breathing, nonlabored ventilation, respiratory function stable and patient connected to nasal cannula oxygen Cardiovascular status: blood pressure returned to baseline and stable Postop Assessment: no signs of nausea or vomiting Anesthetic complications: no    Last Vitals:  Vitals:   10/22/15 1445 10/22/15 1530  BP: (!) 109/97 (!) 122/56  Pulse: 88 87  Resp: 17 20  Temp:  36.6 C    Last Pain:  Vitals:   10/22/15 1530  PainSc: 0-No pain                 Evanne Matsunaga J

## 2015-10-22 NOTE — Transfer of Care (Signed)
Immediate Anesthesia Transfer of Care Note  Patient: Patty [name]  Procedure(s) Performed: Procedure(s) with comments: SHOULDER ARTHROSCOPYWITH  DEBRIDEMENT,  SUBACROMIAL DECOMPRESSION AND DISTAL CLAVICAL EXCISION (Right) - SHOULDER ARTHROSCOPY DEBRIDEMENT ROTATOR CUFF TEAR, SUBACROMIAL DECOMPRESSION AND DISTAL CLAVICAL EXCISION  Patient Location: PACU  Anesthesia Type:GA combined with regional for post-op pain  Level of Consciousness: sedated  Airway & Oxygen Therapy: Patient Spontanous Breathing and Patient connected to face mask oxygen  Post-op Assessment: Report given to RN and Post -op Vital signs reviewed and stable  Post vital signs: Reviewed and stable  Last Vitals:  Vitals:   10/22/15 1428 10/22/15 1429  BP:    Pulse: 95 93  Resp:  (!) 25    Last Pain: There were no vitals filed for this visit.       Complications: No apparent anesthesia complications

## 2015-10-22 NOTE — Anesthesia Procedure Notes (Addendum)
Anesthesia Regional Block:  Interscalene brachial plexus block  Pre-Anesthetic Checklist: ,, timeout performed, Correct Patient, Correct Site, Correct Laterality, Correct Procedure, Correct Position, site marked, Risks and benefits discussed,  Surgical consent,  Pre-op evaluation,  At surgeon's request and post-op pain management  Laterality: Right and Upper  Prep: chloraprep       Needles:  Injection technique: Single-shot  Needle Type: Echogenic Needle     Needle Length: 10cm 10 cm Needle Gauge: 21 and 21 G    Additional Needles:  Procedures: ultrasound guided (picture in chart) Interscalene brachial plexus block Narrative:  Injection made incrementally with aspirations every 5 mL.  Performed by: Personally  Anesthesiologist: Sherrian Divers  Additional Notes: No pain with injection. No increased resistance with injection. Tolerated well.

## 2015-10-22 NOTE — Anesthesia Preprocedure Evaluation (Signed)
Anesthesia Evaluation  Patient identified by MRN, date of birth, ID band Patient awake and Patient confused    Reviewed: Allergy & Precautions, NPO status , Patient's Chart, lab work & pertinent test results  Airway Mallampati: II  TM Distance: >3 FB Neck ROM: Full    Dental no notable dental hx.    Pulmonary neg pulmonary ROS,    Pulmonary exam normal breath sounds clear to auscultation       Cardiovascular + CAD  Normal cardiovascular exam Rhythm:Regular Rate:Normal     Neuro/Psych  Neuromuscular disease negative psych ROS   GI/Hepatic Neg liver ROS, GERD  ,  Endo/Other  negative endocrine ROS  Renal/GU negative Renal ROS  negative genitourinary   Musculoskeletal  (+) Arthritis , Fibromyalgia -  Abdominal (+) + obese,   Peds negative pediatric ROS (+)  Hematology negative hematology ROS (+)   Anesthesia Other Findings   Reproductive/Obstetrics negative OB ROS                             Anesthesia Physical Anesthesia Plan  ASA: III  Anesthesia Plan: General   Post-op Pain Management: GA combined w/ Regional for post-op pain   Induction: Intravenous  Airway Management Planned: Oral ETT  Additional Equipment:   Intra-op Plan:   Post-operative Plan: Extubation in OR  Informed Consent: I have reviewed the patients History and Physical, chart, labs and discussed the procedure including the risks, benefits and alternatives for the proposed anesthesia with the patient or authorized representative who has indicated his/her understanding and acceptance.   Dental advisory given  Plan Discussed with: CRNA  Anesthesia Plan Comments: (Discussed risks and benefits of interscalene block including failure, bleeding, infection, nerve damage, weakness. Questions answered. Patient consents to block.)        Anesthesia Quick Evaluation

## 2015-10-22 NOTE — Anesthesia Procedure Notes (Signed)
Procedure Name: Intubation Date/Time: 10/22/2015 1:26 PM Performed by: Zenia Resides D Pre-anesthesia Checklist: Patient identified, Emergency Drugs available, Suction available and Patient being monitored Patient Re-evaluated:Patient Re-evaluated prior to inductionOxygen Delivery Method: Circle system utilized Preoxygenation: Pre-oxygenation with 100% oxygen Intubation Type: IV induction Ventilation: Mask ventilation without difficulty Laryngoscope Size: Mac and 3 Grade View: Grade I Tube type: Oral Number of attempts: 1 Airway Equipment and Method: Stylet and Oral airway Placement Confirmation: ETT inserted through vocal cords under direct vision,  positive ETCO2 and breath sounds checked- equal and bilateral Secured at: 22 cm Tube secured with: Tape Dental Injury: Teeth and Oropharynx as per pre-operative assessment

## 2015-10-23 NOTE — Addendum Note (Signed)
Addendum  created 10/23/15 0831 by Lance Coon, CRNA   Charge Capture section accepted

## 2015-10-29 DIAGNOSIS — M25562 Pain in left knee: Secondary | ICD-10-CM | POA: Diagnosis not present

## 2015-10-29 DIAGNOSIS — M19011 Primary osteoarthritis, right shoulder: Secondary | ICD-10-CM | POA: Diagnosis not present

## 2015-10-31 ENCOUNTER — Other Ambulatory Visit: Payer: Commercial Managed Care - HMO

## 2015-11-06 DIAGNOSIS — M25511 Pain in right shoulder: Secondary | ICD-10-CM | POA: Diagnosis not present

## 2015-11-06 DIAGNOSIS — M7501 Adhesive capsulitis of right shoulder: Secondary | ICD-10-CM | POA: Diagnosis not present

## 2015-11-06 NOTE — Addendum Note (Signed)
Addendum  created 11/06/15 1324 by Sherrian Divers, MD   Anesthesia Intra Blocks edited, Sign clinical note

## 2015-11-13 DIAGNOSIS — M7501 Adhesive capsulitis of right shoulder: Secondary | ICD-10-CM | POA: Diagnosis not present

## 2015-11-13 DIAGNOSIS — M25511 Pain in right shoulder: Secondary | ICD-10-CM | POA: Diagnosis not present

## 2015-11-14 DIAGNOSIS — M25569 Pain in unspecified knee: Secondary | ICD-10-CM | POA: Diagnosis not present

## 2015-11-14 DIAGNOSIS — F324 Major depressive disorder, single episode, in partial remission: Secondary | ICD-10-CM | POA: Diagnosis not present

## 2015-11-16 DIAGNOSIS — M7501 Adhesive capsulitis of right shoulder: Secondary | ICD-10-CM | POA: Diagnosis not present

## 2015-11-16 DIAGNOSIS — M25511 Pain in right shoulder: Secondary | ICD-10-CM | POA: Diagnosis not present

## 2015-11-16 DIAGNOSIS — M25561 Pain in right knee: Secondary | ICD-10-CM | POA: Diagnosis not present

## 2015-11-21 ENCOUNTER — Other Ambulatory Visit: Payer: Commercial Managed Care - HMO

## 2015-11-26 DIAGNOSIS — Z9889 Other specified postprocedural states: Secondary | ICD-10-CM | POA: Diagnosis not present

## 2015-11-29 DIAGNOSIS — M25511 Pain in right shoulder: Secondary | ICD-10-CM | POA: Diagnosis not present

## 2015-11-29 DIAGNOSIS — M7501 Adhesive capsulitis of right shoulder: Secondary | ICD-10-CM | POA: Diagnosis not present

## 2015-12-03 ENCOUNTER — Ambulatory Visit
Admission: RE | Admit: 2015-12-03 | Discharge: 2015-12-03 | Disposition: A | Discharge status: Home / Self Care | Payer: Commercial Managed Care - HMO | Source: Ambulatory Visit | Attending: Physician Assistant | Admitting: Physician Assistant

## 2015-12-03 DIAGNOSIS — R1013 Epigastric pain: Secondary | ICD-10-CM | POA: Diagnosis not present

## 2015-12-03 DIAGNOSIS — R110 Nausea: Secondary | ICD-10-CM

## 2015-12-05 DIAGNOSIS — M7501 Adhesive capsulitis of right shoulder: Secondary | ICD-10-CM | POA: Diagnosis not present

## 2015-12-05 DIAGNOSIS — M25511 Pain in right shoulder: Secondary | ICD-10-CM | POA: Diagnosis not present

## 2015-12-07 DIAGNOSIS — M7501 Adhesive capsulitis of right shoulder: Secondary | ICD-10-CM | POA: Diagnosis not present

## 2015-12-07 DIAGNOSIS — M25511 Pain in right shoulder: Secondary | ICD-10-CM | POA: Diagnosis not present

## 2015-12-08 ENCOUNTER — Other Ambulatory Visit: Payer: Self-pay | Admitting: Family Medicine

## 2015-12-08 DIAGNOSIS — R935 Abnormal findings on diagnostic imaging of other abdominal regions, including retroperitoneum: Secondary | ICD-10-CM

## 2015-12-19 ENCOUNTER — Ambulatory Visit
Admission: RE | Admit: 2015-12-19 | Discharge: 2015-12-19 | Disposition: A | Discharge status: Home / Self Care | Payer: Commercial Managed Care - HMO | Source: Ambulatory Visit | Attending: Family Medicine | Admitting: Family Medicine

## 2015-12-19 DIAGNOSIS — R935 Abnormal findings on diagnostic imaging of other abdominal regions, including retroperitoneum: Secondary | ICD-10-CM | POA: Diagnosis not present

## 2015-12-19 DIAGNOSIS — R1013 Epigastric pain: Secondary | ICD-10-CM | POA: Diagnosis not present

## 2015-12-19 MED ORDER — GADOBENATE DIMEGLUMINE 529 MG/ML IV SOLN
18.0000 mL | Freq: Once | INTRAVENOUS | Status: AC | PRN
  Administered 2015-12-19: 18 mL via INTRAVENOUS

## 2015-12-21 DIAGNOSIS — Z9889 Other specified postprocedural states: Secondary | ICD-10-CM | POA: Diagnosis not present

## 2015-12-27 DIAGNOSIS — H40022 Open angle with borderline findings, high risk, left eye: Secondary | ICD-10-CM | POA: Diagnosis not present

## 2015-12-27 DIAGNOSIS — H25013 Cortical age-related cataract, bilateral: Secondary | ICD-10-CM | POA: Diagnosis not present

## 2015-12-31 DIAGNOSIS — K219 Gastro-esophageal reflux disease without esophagitis: Secondary | ICD-10-CM | POA: Diagnosis not present

## 2015-12-31 DIAGNOSIS — I201 Angina pectoris with documented spasm: Secondary | ICD-10-CM | POA: Diagnosis not present

## 2015-12-31 DIAGNOSIS — R079 Chest pain, unspecified: Secondary | ICD-10-CM | POA: Diagnosis not present

## 2015-12-31 DIAGNOSIS — G4710 Hypersomnia, unspecified: Secondary | ICD-10-CM | POA: Diagnosis not present

## 2015-12-31 DIAGNOSIS — R351 Nocturia: Secondary | ICD-10-CM | POA: Diagnosis not present

## 2015-12-31 DIAGNOSIS — R932 Abnormal findings on diagnostic imaging of liver and biliary tract: Secondary | ICD-10-CM | POA: Diagnosis not present

## 2016-01-11 ENCOUNTER — Encounter: Payer: Self-pay | Admitting: *Deleted

## 2016-01-17 DIAGNOSIS — R110 Nausea: Secondary | ICD-10-CM | POA: Diagnosis not present

## 2016-01-17 DIAGNOSIS — N7689 Other specified inflammation of vagina and vulva: Secondary | ICD-10-CM | POA: Diagnosis not present

## 2016-01-17 DIAGNOSIS — L298 Other pruritus: Secondary | ICD-10-CM | POA: Diagnosis not present

## 2016-01-25 ENCOUNTER — Ambulatory Visit (INDEPENDENT_AMBULATORY_CARE_PROVIDER_SITE_OTHER): Payer: Commercial Managed Care - HMO | Admitting: Internal Medicine

## 2016-01-25 ENCOUNTER — Encounter: Payer: Self-pay | Admitting: Internal Medicine

## 2016-01-25 ENCOUNTER — Encounter (INDEPENDENT_AMBULATORY_CARE_PROVIDER_SITE_OTHER): Payer: Self-pay

## 2016-01-25 VITALS — BP 124/60 | HR 84 | Ht 63.0 in | Wt 194.0 lb

## 2016-01-25 DIAGNOSIS — R5383 Other fatigue: Secondary | ICD-10-CM

## 2016-01-25 DIAGNOSIS — R079 Chest pain, unspecified: Secondary | ICD-10-CM | POA: Diagnosis not present

## 2016-01-25 DIAGNOSIS — R0602 Shortness of breath: Secondary | ICD-10-CM | POA: Diagnosis not present

## 2016-01-25 LAB — LIPID PANEL
CHOL/HDL RATIO: 3 ratio (ref <5.0)
Cholesterol: 203 mg/dL — ABNORMAL HIGH (ref <200)
HDL: 67 mg/dL (ref >50)
LDL CALC: 107 mg/dL — AB (ref <100)
TRIGLYCERIDES: 144 mg/dL (ref <150)
VLDL: 29 mg/dL (ref <30)

## 2016-01-25 NOTE — Patient Instructions (Signed)
Medication Instructions:  Your physician recommends that you continue on your current medications as directed. Please refer to the Current Medication list given to you today.   Labwork: Lipid profile today  Testing/Procedures: Your physician has requested that you have an echocardiogram. Echocardiography is a painless test that uses sound waves to create images of your heart. It provides your doctor with information about the size and shape of your heart and how well your heart's chambers and valves are working. This procedure takes approximately one hour. There are no restrictions for this procedure.  Your physician has requested that you have a lexiscan myoview. For further information please visit [email or web address] Please follow instruction sheet, as given.    Follow-Up: Your physician recommends that you schedule a follow-up appointment in: 6-8 weeks with Dr End.    Any Other Special Instructions Will Be Listed Below (If Applicable).     If you need a refill on your cardiac medications before your next appointment, please call your pharmacy.

## 2016-01-25 NOTE — Progress Notes (Signed)
New Outpatient Visit Date: 01/25/2016  Referring Provider: Duane LopeAlan Ross, MD  [address] [place] [address]  Chief Complaint: Chest pain  HPI:  [name] is a 75 y.o. year-old female with history of coronary vasospasm (Prinzmetal's angina) diagnosed by cath in 2000, mitral valve prolapse, prediabetes, GERD, and fibromyalgia who has been referred by Dr. Tenny Crawoss for evaluation of chest pain. The patient reports that she experienced right-sided chest pain in early 2000, that was attributed to coronary vasospasm. This had been relatively well controlled with diltiazem., However over the last several months, she has noted pressure in the left upper portion of her chest without radiation. It typically lasts 3-5 minutes and sometimes occurs at rest and sometimes with exertion. The maximal intensity of the pain is 5/10. She does not have any associated symptoms, though she notes at other times she feels excessively fatigued and nauseated. The patient has not tried nitroglycerin for this pain; she avoids it due to headaches that she has experienced one using NTG in the past. She also reports intermittent lightheadedness lasting 1 or 2 seconds, which is not positional or related to the aforementioned chest pain. She also denies palpitations as well as shortness of breath, orthopnea, PND, and leg edema.  The patient also notes that she has not been sleeping well at night. She attributes this to discomfort from recent shoulder surgery. She has been using over-the-counter sleep aids with only modest success. She reports a negative sleep study several years ago. She does not exercise on a regular basis due to back pain.  --------------------------------------------------------------------------------------------------  Cardiovascular History & Procedures: Cardiovascular Problems:  Atypical chest pain with history of coronary vasospasm  Risk Factors:  Prediabetes and age greater than  6465  Cath/PCI:  LHC (01/23/99): LMCA normal. LAD normal. LCx normal. Dominant RCA with ostial vasospasm that improved after intracoronary NTG. No significant atherosclerotic disease. Normal LV contraction with mildly elevated LVEDP (23 mmHg).  CV Surgery:  None  EP Procedures and Devices:  None  Non-Invasive Evaluation(s):  Exercise myocardial perfusion stress test (04/20/03): No perfusion defects. LVEF 76%.  Recent CV Pertinent Labs: See below.  --------------------------------------------------------------------------------------------------  Past Medical History:  Diagnosis Date  . Arthritis   . Fibromyalgia   . GERD (gastroesophageal reflux disease)   . Glaucoma   . MVP (mitral valve prolapse)   . Prediabetes   . Prinzmetal angina (HCC) 2005  . Rash of hands     Past Surgical History:  Procedure Laterality Date  . ABDOMINAL HYSTERECTOMY  1975  . CARDIAC CATHETERIZATION  2000   spasm only  . SHOULDER ARTHROSCOPY W/ ROTATOR CUFF REPAIR Bilateral     Outpatient Encounter Prescriptions as of 01/25/2016  Medication Sig  . B Complex Vitamins (VITAMIN B COMPLEX PO) Take by mouth.  . Cholecalciferol (VITAMIN D3) 1000 units CAPS Take by mouth.  . Coenzyme Q10 (COQ-10) 100 MG CAPS Take by mouth.  . diltiazem (CARDIZEM CD) 180 MG 24 hr capsule Take 180 mg by mouth daily.  . Doxylamine Succinate, Sleep, (SLEEP AID PO) Take by mouth at bedtime as needed.  Marland Kitchen. estradiol (ESTRACE) 0.5 MG tablet Take 0.5 mg by mouth daily.  Marland Kitchen. MAGNESIUM PO Take 133 mg by mouth daily.  Marland Kitchen. omeprazole (PRILOSEC) 20 MG capsule Take 20 mg by mouth daily.  . Saccharomyces boulardii (PROBIOTIC) 250 MG CAPS Take by mouth.  . [DISCONTINUED] docusate sodium (COLACE) 100 MG capsule Take 1 capsule (100 mg total) by mouth 3 (three) times daily as  needed. (Patient not taking: Reported on 01/25/2016)  . [DISCONTINUED] doxylamine, Sleep, (SLEEP AID) 25 MG tablet Take 25 mg by mouth at bedtime as needed.  .  [DISCONTINUED] ibuprofen (ADVIL,MOTRIN) 200 MG tablet Take 200 mg by mouth every 6 (six) hours as needed.  . [DISCONTINUED] magnesium 30 MG tablet Take 30 mg by mouth 2 (two) times daily.  . [DISCONTINUED] oxyCODONE-acetaminophen (ROXICET) 5-325 MG tablet Take 1-2 tablets by mouth every 4 (four) hours as needed for severe pain. (Patient not taking: Reported on 01/25/2016)   No facility-administered encounter medications on file as of 01/25/2016.     Allergies: Melatonin; Meloxicam; Penicillins; and Tramadol  Social History   Social History  . Marital status: Divorced    Spouse name: N/A  . Number of children: N/A  . Years of education: N/A   Occupational History  . Not on file.   Social History Main Topics  . Smoking status: Never Smoker  . Smokeless tobacco: Never Used  . Alcohol use Yes     Comment: social  . Drug use: Unknown  . Sexual activity: Not on file   Other Topics Concern  . Not on file   Social History Narrative  . No narrative on file    Family History  Problem Relation Age of Onset  . Healthy Mother 78  . Alzheimer's disease Father 40    Review of Systems: A 12-system review of systems was performed and was negative except as noted in the HPI.  --------------------------------------------------------------------------------------------------  Physical Exam: BP 124/60   Pulse 84   Ht 5\' 3"  (1.6 m)   Wt 194 lb (88 kg)   BMI 34.37 kg/m   General:  Overweight woman, seated completely knee exam room. HEENT: No conjunctival pallor or scleral icterus.  Moist mucous membranes.  OP clear. Neck: Supple without lymphadenopathy, thyromegaly, JVD, or HJR.  No carotid bruit. Lungs: Normal work of breathing.  Clear to auscultation bilaterally without wheezes or crackles. Heart: Regular rate and rhythm 1/6 systolic murmur loudest at the left upper sternal border. No rubs or gallops.  Non-displaced PMI. Abd: Bowel sounds present.  Soft, NT/ND without  hepatosplenomegaly Ext: No lower extremity edema.  Radial, PT, and DP pulses are 2+ bilaterally Skin: warm and dry without rash Neuro: CNIII-XII intact.  Strength and fine-touch sensation intact in upper and lower extremities bilaterally. Psych: Normal mood and affect.  EKG:  Normal sinus rhythm with poor R-wave progression in the precordial leads, which could reflect prior anterior infarct or lead placement. No significant change from prior tracing on 10/17/15 (I have personally reviewed today's and previous EKGs).  Outside labs: TSH (10/05/15): 4.25  Hemoglobin A1c (10/05/15): 6.0%  CBC (06/26/15): White blood cell count 9.6, hemoglobin 11.9, hematocrit 36.9, platelets 315  CMP (06/26/15): Sodium 139, potassium 4.0, chloride 102, CO2 29, BUN 14, creatinine 0.71, glucose 64, AST 15, ALT 14, alkaline phosphatase 58, total bilirubin 0.4, total protein 6.7, albumin 4.0  --------------------------------------------------------------------------------------------------  ASSESSMENT AND PLAN: Atypical chest pain The patient has a history of coronary vasospasm but notes that her more recent pain over the last few months is different. The pressure can occur both at rest and with exertion, but does not happen every time the patient exerts herself. EKG demonstrates poor R-wave progression in the anterior leads, which may reflect lead placement versus prior MI. We have agreed to perform a pharmacologic myocardial perfusion stress test for further evaluation. We will also check a lipid panel today for risk stratification. The patient  was advised to begin taking a low-dose aspirin. We will continue diltiazem, given her history of coronary vasospasm.  Fatigue This is likely multifactorial. The patient appears euvolemic on exam today. She also notes occasional lightheadedness that does not seem positional or related to any particular activity. We have agreed to perform a transthoracic echocardiogram to evaluate  for structural heart abnormalities that may be contributing to her symptoms.  Follow-up: Return to clinic in 6-8 weeks  Yvonne Kendall, MD 01/25/2016 3:37 PM

## 2016-01-26 MED ORDER — ASPIRIN EC 81 MG PO TBEC: 81.0000 mg | DELAYED_RELEASE_TABLET | Freq: Every day | ORAL | Status: AC

## 2016-01-29 DIAGNOSIS — B373 Candidiasis of vulva and vagina: Secondary | ICD-10-CM | POA: Diagnosis not present

## 2016-02-19 ENCOUNTER — Telehealth (HOSPITAL_COMMUNITY): Payer: Self-pay | Admitting: *Deleted

## 2016-02-19 NOTE — Telephone Encounter (Signed)
Patient given detailed instructions per Myocardial Perfusion Study Information Sheet for the test on 02/21/16 at 0945. Patient notified to arrive 15 minutes early and that it is imperative to arrive on time for appointment to keep from having the test rescheduled.  If you need to cancel or reschedule your appointment, please call the office within 24 hours of your appointment. Failure to do so may result in a cancellation of your appointment, and a $50 no show fee. Patient verbalized understanding.Meliya Mcconahy, Adelene Idler

## 2016-02-21 ENCOUNTER — Other Ambulatory Visit (HOSPITAL_COMMUNITY): Payer: Commercial Managed Care - HMO

## 2016-02-21 ENCOUNTER — Encounter (HOSPITAL_COMMUNITY): Payer: Commercial Managed Care - HMO

## 2016-02-25 ENCOUNTER — Ambulatory Visit: Payer: Commercial Managed Care - HMO | Admitting: Internal Medicine

## 2016-03-06 ENCOUNTER — Encounter: Payer: Self-pay | Admitting: *Deleted

## 2016-03-11 ENCOUNTER — Telehealth (HOSPITAL_COMMUNITY): Payer: Self-pay | Admitting: *Deleted

## 2016-03-11 NOTE — Telephone Encounter (Signed)
Patient given detailed instructions per Myocardial Perfusion Study Information Sheet for the test on 03/14/16 at 0930. Patient notified to arrive 15 minutes early and that it is imperative to arrive on time for appointment to keep from having the test rescheduled.  If you need to cancel or reschedule your appointment, please call the office within 24 hours of your appointment. Failure to do so may result in a cancellation of your appointment, and a $50 no show fee. Patient verbalized understanding.Sabir Charters, Adelene Idler

## 2016-03-14 ENCOUNTER — Ambulatory Visit (HOSPITAL_COMMUNITY): Payer: Commercial Managed Care - HMO | Attending: Cardiovascular Disease

## 2016-03-14 ENCOUNTER — Ambulatory Visit (HOSPITAL_BASED_OUTPATIENT_CLINIC_OR_DEPARTMENT_OTHER): Payer: Commercial Managed Care - HMO

## 2016-03-14 ENCOUNTER — Other Ambulatory Visit: Payer: Self-pay

## 2016-03-14 DIAGNOSIS — R079 Chest pain, unspecified: Secondary | ICD-10-CM | POA: Insufficient documentation

## 2016-03-14 DIAGNOSIS — I501 Left ventricular failure: Secondary | ICD-10-CM | POA: Insufficient documentation

## 2016-03-14 LAB — MYOCARDIAL PERFUSION IMAGING
LHR: 0.29
LVDIAVOL: 50 mL (ref 46–106)
LVSYSVOL: 10 mL
NUC STRESS TID: 1.01
Peak HR: 110 {beats}/min
Rest HR: 76 {beats}/min
SDS: 4
SRS: 10
SSS: 12

## 2016-03-14 MED ORDER — TECHNETIUM TC 99M TETROFOSMIN IV KIT
33.0000 | PACK | Freq: Once | INTRAVENOUS | Status: AC | PRN
  Administered 2016-03-14: 33 via INTRAVENOUS
  Filled 2016-03-14: qty 33

## 2016-03-14 MED ORDER — REGADENOSON 0.4 MG/5ML IV SOLN
0.4000 mg | Freq: Once | INTRAVENOUS | Status: AC
Start: 2016-03-14 — End: 2016-03-14
  Administered 2016-03-14: 0.4 mg via INTRAVENOUS

## 2016-03-14 MED ORDER — TECHNETIUM TC 99M TETROFOSMIN IV KIT
11.0000 | PACK | Freq: Once | INTRAVENOUS | Status: AC | PRN
  Administered 2016-03-14: 11 via INTRAVENOUS
  Filled 2016-03-14: qty 11

## 2016-03-28 ENCOUNTER — Encounter (INDEPENDENT_AMBULATORY_CARE_PROVIDER_SITE_OTHER): Payer: Self-pay

## 2016-03-28 ENCOUNTER — Ambulatory Visit (INDEPENDENT_AMBULATORY_CARE_PROVIDER_SITE_OTHER): Payer: Medicare HMO | Admitting: Internal Medicine

## 2016-03-28 VITALS — BP 120/74 | HR 76 | Ht 62.0 in | Wt 191.6 lb

## 2016-03-28 DIAGNOSIS — R0789 Other chest pain: Secondary | ICD-10-CM | POA: Diagnosis not present

## 2016-03-28 DIAGNOSIS — R5383 Other fatigue: Secondary | ICD-10-CM | POA: Diagnosis not present

## 2016-03-28 NOTE — Patient Instructions (Signed)
Medication Instructions:  Your physician recommends that you continue on your current medications as directed. Please refer to the Current Medication list given to you today.   Labwork: None   Testing/Procedures: None   Follow-Up: Your physician wants you to follow-up in: 1 year with Dr End. (January 2019).  You will receive a reminder letter in the mail two months in advance. If you don't receive a letter, please call our office to schedule the follow-up appointment.        If you need a refill on your cardiac medications before your next appointment, please call your pharmacy.   

## 2016-03-28 NOTE — Progress Notes (Signed)
Follow-up Outpatient Visit Date: 03/28/2016  Chief Complaint: Chest pain  HPI:  Ms. Boerst is a 76 y.o. year-old female with history of coronary vasospasm (Prinzmetal's angina) diagnosed by cath in 2000, mitral valve prolapse, prediabetes, GERD, and fibromyalgia , who presents for follow-up of chest pain. I last saw the patient on 01/25/16, which time she reported intermittent left upper chest pressure without radiation to be lasting 3-5 minutes both at rest and with exertion. Prior workup including left heart catheterization in 2000 and exercise myocardial perfusion stress test 2005 were negative for fixed coronary artery disease. Spasm at the ostium of the RCA was noted during cardiac catheterization. Echocardiogram and pharmacologic myocardial perfusion stress test after her last visit showed no significant abnormalities. She continues to have left upper chest pain 1-2 times per week. It is not exertional or related to a particular position/activity. The pain is sharp (like needles) and typically lasts 1-2 minutes before resolving on its own. She denies accompanying shortness of breath. She has occasional palpitations and "quivering" of the stomach, which is not related to the chest pain. She associates the palpitations with her mitral valve prolapse, which has been present for many years. She has not taken any nitroglycerin since our last visit.  --------------------------------------------------------------------------------------------------  Cardiovascular History & Procedures: Cardiovascular Problems:  Coronary vasospasm  Risk Factors:  Prediabetes and age greater than 79  Cath/PCI:  LHC (01/23/99): LMCA normal. LAD normal. LCx normal. Dominant RCA with ostial vasospasm that improved after intracoronary NTG. No significant atherosclerotic disease. Normal LV contraction with mildly elevated LVEDP (23 mmHg).  CV Surgery:  None  EP Procedures and Devices:  None  Non-Invasive  Evaluation(s):  Transthoracic echocardiogram (03/14/16): Normal LV size and wall thickness with an EF of 60-65%. Normal wall motion. Grade 1 diastolic dysfunction. Mild left atrial enlargement. Normal RV size and function. No significant valvular abnormalities.  Pharmacologic myocardial perfusion stress test (03/14/16): Normal study without evidence of ischemia or infarction. LVEF greater than 65%.  Recent CV Pertinent Labs: Lab Results  Component Value Date   CHOL 203 (H) 01/25/2016   HDL 67 01/25/2016   LDLCALC 107 (H) 01/25/2016   TRIG 144 01/25/2016   CHOLHDL 3.0 01/25/2016    Past medical and surgical history were reviewed and updated in EPIC.   Outpatient Encounter Prescriptions as of 03/28/2016  Medication Sig  . aspirin EC 81 MG tablet Take 1 tablet (81 mg total) by mouth daily.  . B Complex Vitamins (VITAMIN B COMPLEX PO) Take 1 Dose by mouth daily.   . Cholecalciferol (VITAMIN D3) 1000 units CAPS Take 1 capsule by mouth daily.   . Coenzyme Q10 (COQ-10) 100 MG CAPS Take 1 capsule by mouth daily.   Marland Kitchen diltiazem (CARDIZEM CD) 180 MG 24 hr capsule Take 180 mg by mouth daily.  . Doxylamine Succinate, Sleep, (SLEEP AID PO) Take 1 Dose by mouth at bedtime as needed (sleep).   Marland Kitchen estradiol (ESTRACE) 0.5 MG tablet Take 0.5 mg by mouth daily.  Marland Kitchen MAGNESIUM PO Take 133 mg by mouth daily.  . nitroGLYCERIN (NITROSTAT) 0.4 MG SL tablet Place 0.4 mg under the tongue as directed. Place one tablet under the tongue every 5 minutes as needed for chest pain  . omeprazole (PRILOSEC) 20 MG capsule Take 20 mg by mouth daily.  . Saccharomyces boulardii (PROBIOTIC) 250 MG CAPS Take 1 Dose by mouth daily.    No facility-administered encounter medications on file as of 03/28/2016.     Allergies: Melatonin; Meloxicam; Penicillins;  and Tramadol  Social History   Social History  . Marital status: Divorced    Spouse name: N/A  . Number of children: N/A  . Years of education: N/A   Occupational  History  . Not on file.   Social History Main Topics  . Smoking status: Former Smoker    Packs/day: 1.00    Years: 30.00    Quit date: 1989  . Smokeless tobacco: Never Used  . Alcohol use No     Comment: social; glass of wine once very 2-3 months  . Drug use: No  . Sexual activity: Not on file   Other Topics Concern  . Not on file   Social History Narrative  . No narrative on file    Family History  Problem Relation Age of Onset  . Healthy Mother 35  . Alzheimer's disease Father 66    Review of Systems: The patient reports continued excessive fatigue as well as anxiety. Otherwise, a 12-system review of systems was performed and was negative except as noted in the HPI.  --------------------------------------------------------------------------------------------------  Physical Exam: BP 120/74   Pulse 76   Ht 5\' 2"  (1.575 m)   Wt 191 lb 9.6 oz (86.9 kg)   BMI 35.04 kg/m   General:  Obese woman, seated comfortably in the exam room. HEENT: No conjunctival pallor or scleral icterus.  Moist mucous membranes.  OP clear. Neck: Supple without lymphadenopathy, thyromegaly, JVD, or HJR.  No carotid bruit. Lungs: Normal work of breathing.  Clear to auscultation bilaterally without wheezes or crackles. Heart: Regular rate and rhythm without murmurs, rubs, or gallops. Unable to assess PMI due to body habitus. Abd: Bowel sounds present.  Soft, NT/ND without hepatosplenomegaly Ext: No lower extremity edema.  Radial, PT, and DP pulses are 2+ bilaterally. Skin: warm and dry without rash  Lab Results  Component Value Date   CHOL 203 (H) 01/25/2016   HDL 67 01/25/2016   LDLCALC 107 (H) 01/25/2016   TRIG 144 01/25/2016   CHOLHDL 3.0 01/25/2016    --------------------------------------------------------------------------------------------------  ASSESSMENT AND PLAN: Chest pain Quality of pain is not suggestive of angina. Myocardial perfusion stress test after our last visit  was also negative. I doubt that she has significant atherosclerotic CAD. Given her history of suspected coronary vasospasm, we will continue with diltiazem. The sharp quality of the pain suggests a possible musculoskeletal etiology. It could also be related to her diagnosis of fibromyalgia. I encouraged Ms. Trias to speak with her PCP about further evaluation and treatment options.  Fatigue This is likely multifactorial. Her echo was notable only for grade 1 diastolic dysfunction and mild left atrial enlargement. The patient appears euvolemic on exam today. I encouraged Ms. Baley to consider exercise to help boost her energy.  Follow-up: Return to clinic in 1 year.  Yvonne Kendall, MD 03/30/2016 12:43 PM

## 2016-03-30 ENCOUNTER — Encounter: Payer: Self-pay | Admitting: Internal Medicine

## 2016-04-08 DIAGNOSIS — M7632 Iliotibial band syndrome, left leg: Secondary | ICD-10-CM | POA: Diagnosis not present

## 2016-04-08 DIAGNOSIS — L309 Dermatitis, unspecified: Secondary | ICD-10-CM | POA: Diagnosis not present

## 2016-04-08 DIAGNOSIS — M5431 Sciatica, right side: Secondary | ICD-10-CM | POA: Diagnosis not present

## 2016-06-27 DIAGNOSIS — M79672 Pain in left foot: Secondary | ICD-10-CM | POA: Diagnosis not present

## 2016-07-23 DIAGNOSIS — G9001 Carotid sinus syncope: Secondary | ICD-10-CM | POA: Diagnosis not present

## 2016-07-23 DIAGNOSIS — M25569 Pain in unspecified knee: Secondary | ICD-10-CM | POA: Diagnosis not present

## 2016-08-20 DIAGNOSIS — M25561 Pain in right knee: Secondary | ICD-10-CM | POA: Diagnosis not present

## 2016-08-28 DIAGNOSIS — H9202 Otalgia, left ear: Secondary | ICD-10-CM | POA: Diagnosis not present

## 2016-08-28 DIAGNOSIS — R6889 Other general symptoms and signs: Secondary | ICD-10-CM | POA: Diagnosis not present

## 2016-08-28 DIAGNOSIS — R5383 Other fatigue: Secondary | ICD-10-CM | POA: Diagnosis not present

## 2016-09-16 DIAGNOSIS — B373 Candidiasis of vulva and vagina: Secondary | ICD-10-CM | POA: Diagnosis not present

## 2016-11-25 DIAGNOSIS — H04123 Dry eye syndrome of bilateral lacrimal glands: Secondary | ICD-10-CM | POA: Diagnosis not present

## 2016-11-25 DIAGNOSIS — H25013 Cortical age-related cataract, bilateral: Secondary | ICD-10-CM | POA: Diagnosis not present

## 2016-11-25 DIAGNOSIS — H40022 Open angle with borderline findings, high risk, left eye: Secondary | ICD-10-CM | POA: Diagnosis not present

## 2017-03-24 DIAGNOSIS — J209 Acute bronchitis, unspecified: Secondary | ICD-10-CM | POA: Diagnosis not present

## 2017-03-24 DIAGNOSIS — N760 Acute vaginitis: Secondary | ICD-10-CM | POA: Diagnosis not present

## 2017-05-15 DIAGNOSIS — M79672 Pain in left foot: Secondary | ICD-10-CM | POA: Diagnosis not present

## 2017-05-27 DIAGNOSIS — H25013 Cortical age-related cataract, bilateral: Secondary | ICD-10-CM | POA: Diagnosis not present

## 2017-05-27 DIAGNOSIS — H40022 Open angle with borderline findings, high risk, left eye: Secondary | ICD-10-CM | POA: Diagnosis not present

## 2017-05-27 DIAGNOSIS — H04123 Dry eye syndrome of bilateral lacrimal glands: Secondary | ICD-10-CM | POA: Diagnosis not present

## 2017-08-06 DIAGNOSIS — G4700 Insomnia, unspecified: Secondary | ICD-10-CM | POA: Diagnosis not present

## 2017-08-06 DIAGNOSIS — D72829 Elevated white blood cell count, unspecified: Secondary | ICD-10-CM | POA: Diagnosis not present

## 2017-08-06 DIAGNOSIS — Z13220 Encounter for screening for lipoid disorders: Secondary | ICD-10-CM | POA: Diagnosis not present

## 2017-08-06 DIAGNOSIS — K219 Gastro-esophageal reflux disease without esophagitis: Secondary | ICD-10-CM | POA: Diagnosis not present

## 2017-08-06 DIAGNOSIS — K760 Fatty (change of) liver, not elsewhere classified: Secondary | ICD-10-CM | POA: Diagnosis not present

## 2017-08-06 DIAGNOSIS — R7303 Prediabetes: Secondary | ICD-10-CM | POA: Diagnosis not present

## 2017-08-06 DIAGNOSIS — F325 Major depressive disorder, single episode, in full remission: Secondary | ICD-10-CM | POA: Diagnosis not present

## 2017-08-06 DIAGNOSIS — I201 Angina pectoris with documented spasm: Secondary | ICD-10-CM | POA: Diagnosis not present

## 2017-10-14 DIAGNOSIS — R7303 Prediabetes: Secondary | ICD-10-CM | POA: Diagnosis not present

## 2017-10-14 DIAGNOSIS — F325 Major depressive disorder, single episode, in full remission: Secondary | ICD-10-CM | POA: Diagnosis not present

## 2017-10-14 DIAGNOSIS — R5383 Other fatigue: Secondary | ICD-10-CM | POA: Diagnosis not present

## 2017-10-14 DIAGNOSIS — R05 Cough: Secondary | ICD-10-CM | POA: Diagnosis not present

## 2017-10-14 DIAGNOSIS — Z136 Encounter for screening for cardiovascular disorders: Secondary | ICD-10-CM | POA: Diagnosis not present

## 2017-10-14 DIAGNOSIS — Z13220 Encounter for screening for lipoid disorders: Secondary | ICD-10-CM | POA: Diagnosis not present

## 2017-10-14 DIAGNOSIS — N760 Acute vaginitis: Secondary | ICD-10-CM | POA: Diagnosis not present

## 2017-10-14 DIAGNOSIS — E538 Deficiency of other specified B group vitamins: Secondary | ICD-10-CM | POA: Diagnosis not present

## 2017-11-10 DIAGNOSIS — J449 Chronic obstructive pulmonary disease, unspecified: Secondary | ICD-10-CM | POA: Diagnosis not present

## 2017-11-10 DIAGNOSIS — F331 Major depressive disorder, recurrent, moderate: Secondary | ICD-10-CM | POA: Diagnosis not present

## 2017-12-25 DIAGNOSIS — M7581 Other shoulder lesions, right shoulder: Secondary | ICD-10-CM | POA: Diagnosis not present

## 2018-02-02 DIAGNOSIS — K621 Rectal polyp: Secondary | ICD-10-CM | POA: Diagnosis not present

## 2018-02-02 DIAGNOSIS — M25511 Pain in right shoulder: Secondary | ICD-10-CM | POA: Diagnosis not present

## 2018-02-04 DIAGNOSIS — M542 Cervicalgia: Secondary | ICD-10-CM | POA: Diagnosis not present

## 2018-02-04 DIAGNOSIS — M25511 Pain in right shoulder: Secondary | ICD-10-CM | POA: Diagnosis not present

## 2018-02-17 DIAGNOSIS — M75121 Complete rotator cuff tear or rupture of right shoulder, not specified as traumatic: Secondary | ICD-10-CM | POA: Diagnosis not present

## 2018-03-24 DIAGNOSIS — M25512 Pain in left shoulder: Secondary | ICD-10-CM | POA: Diagnosis not present

## 2018-03-24 DIAGNOSIS — M25511 Pain in right shoulder: Secondary | ICD-10-CM | POA: Diagnosis not present

## 2018-03-24 DIAGNOSIS — M1711 Unilateral primary osteoarthritis, right knee: Secondary | ICD-10-CM | POA: Diagnosis not present

## 2018-03-31 DIAGNOSIS — H40022 Open angle with borderline findings, high risk, left eye: Secondary | ICD-10-CM | POA: Diagnosis not present

## 2018-03-31 DIAGNOSIS — H04123 Dry eye syndrome of bilateral lacrimal glands: Secondary | ICD-10-CM | POA: Diagnosis not present

## 2018-03-31 DIAGNOSIS — H25013 Cortical age-related cataract, bilateral: Secondary | ICD-10-CM | POA: Diagnosis not present

## 2018-04-14 DIAGNOSIS — M75121 Complete rotator cuff tear or rupture of right shoulder, not specified as traumatic: Secondary | ICD-10-CM | POA: Diagnosis not present

## 2018-04-14 DIAGNOSIS — M5082 Other cervical disc disorders, mid-cervical region, unspecified level: Secondary | ICD-10-CM | POA: Diagnosis not present

## 2018-04-14 DIAGNOSIS — M1711 Unilateral primary osteoarthritis, right knee: Secondary | ICD-10-CM | POA: Diagnosis not present

## 2018-04-20 DIAGNOSIS — M1711 Unilateral primary osteoarthritis, right knee: Secondary | ICD-10-CM | POA: Diagnosis not present

## 2018-04-20 DIAGNOSIS — M5082 Other cervical disc disorders, mid-cervical region, unspecified level: Secondary | ICD-10-CM | POA: Diagnosis not present

## 2018-04-20 DIAGNOSIS — M6281 Muscle weakness (generalized): Secondary | ICD-10-CM | POA: Diagnosis not present

## 2018-04-20 DIAGNOSIS — M75121 Complete rotator cuff tear or rupture of right shoulder, not specified as traumatic: Secondary | ICD-10-CM | POA: Diagnosis not present

## 2018-05-03 DIAGNOSIS — R350 Frequency of micturition: Secondary | ICD-10-CM | POA: Diagnosis not present

## 2018-05-03 DIAGNOSIS — N898 Other specified noninflammatory disorders of vagina: Secondary | ICD-10-CM | POA: Diagnosis not present

## 2018-05-03 DIAGNOSIS — R05 Cough: Secondary | ICD-10-CM | POA: Diagnosis not present

## 2018-05-11 DIAGNOSIS — R05 Cough: Secondary | ICD-10-CM | POA: Diagnosis not present

## 2018-05-11 DIAGNOSIS — M79606 Pain in leg, unspecified: Secondary | ICD-10-CM | POA: Diagnosis not present

## 2018-05-11 DIAGNOSIS — R252 Cramp and spasm: Secondary | ICD-10-CM | POA: Diagnosis not present

## 2018-06-29 IMAGING — US US ABDOMEN COMPLETE
1 series · 14 of 25 positions shown · non-contrast
Comparison: None.

CLINICAL DATA: Nausea, epigastric pain

EXAM:
ABDOMEN ULTRASOUND COMPLETE

[Series 1: us abdomen complete · 0.32mm/px · 14 of 80 slices shown]
[im 1/80]
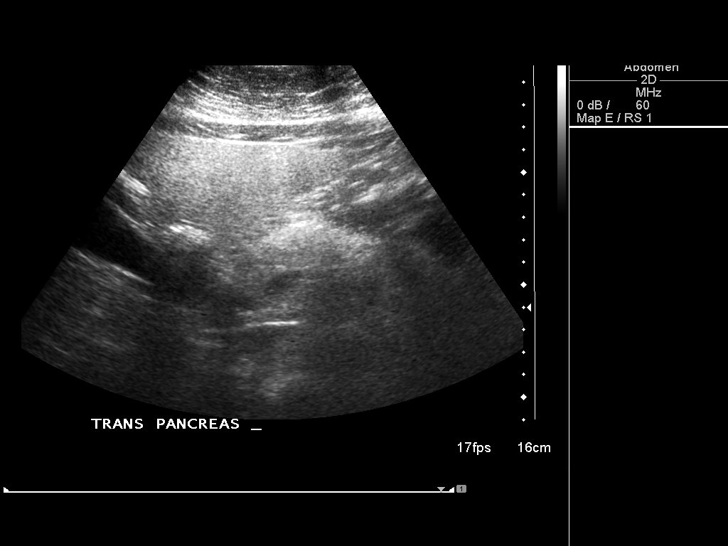
[im 7/80]
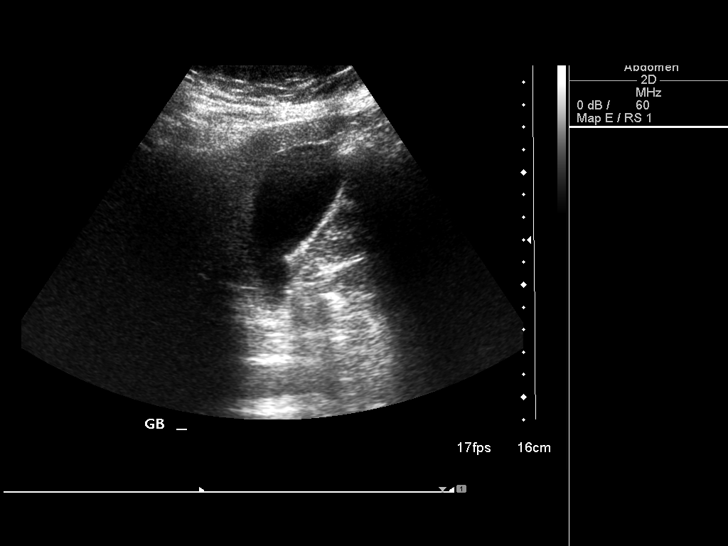
[im 14/80]
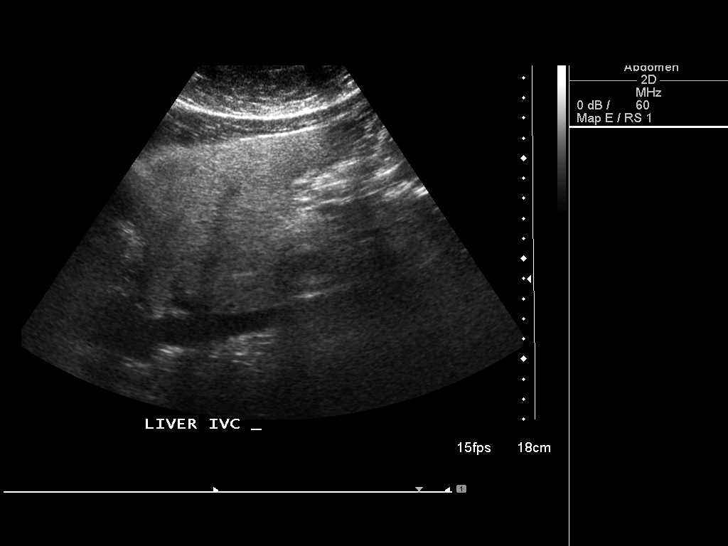
[im 20/80]
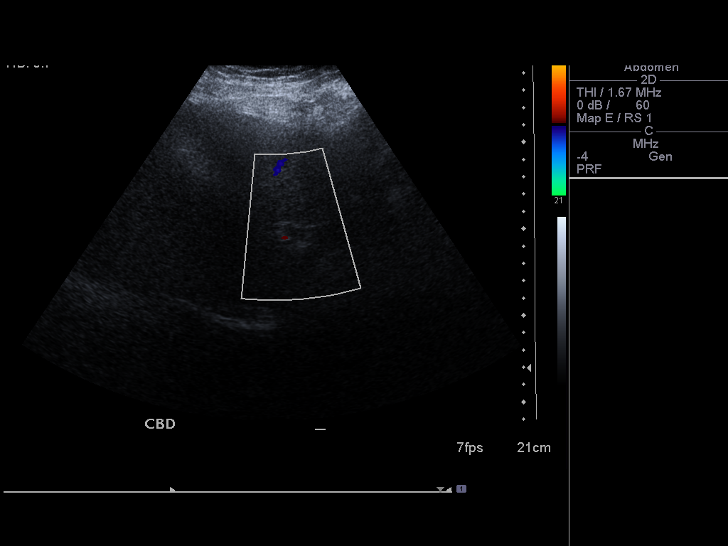
[im 27/80]
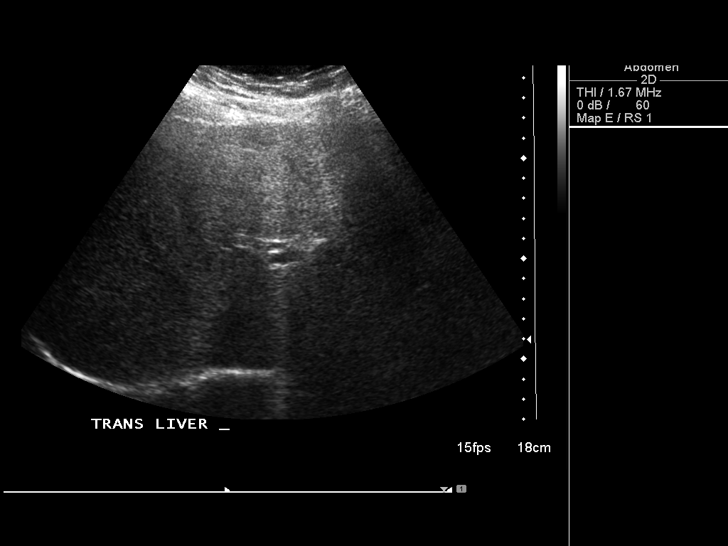
[im 30/80]
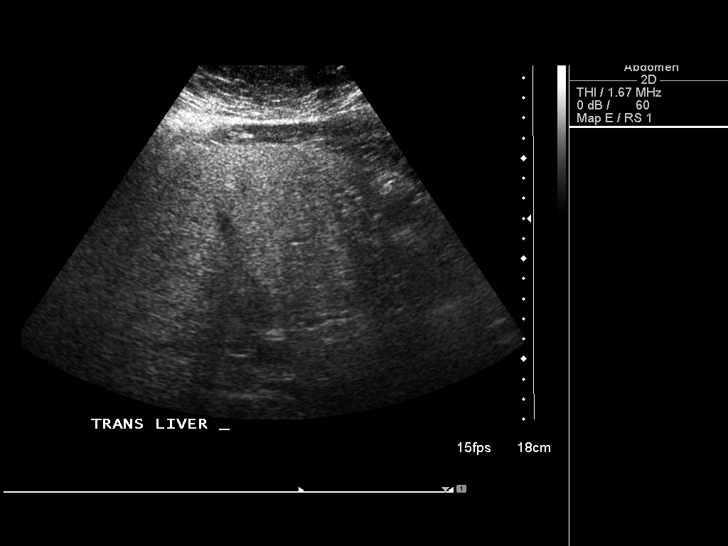
[im 37/80]
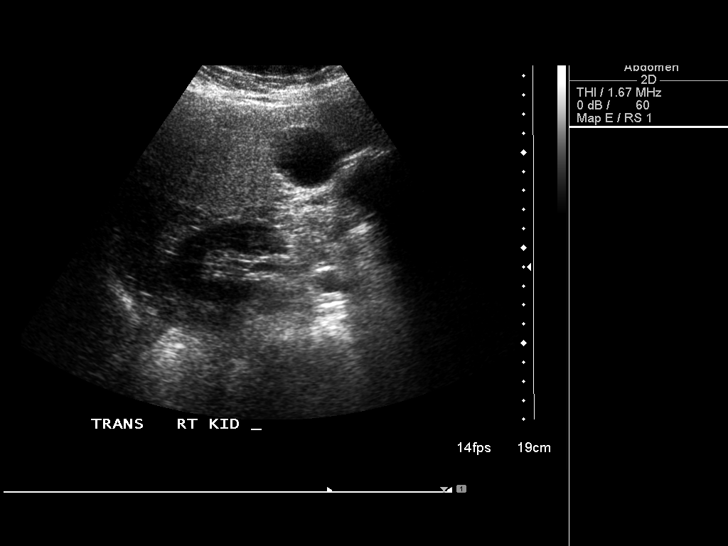
[im 43/80]
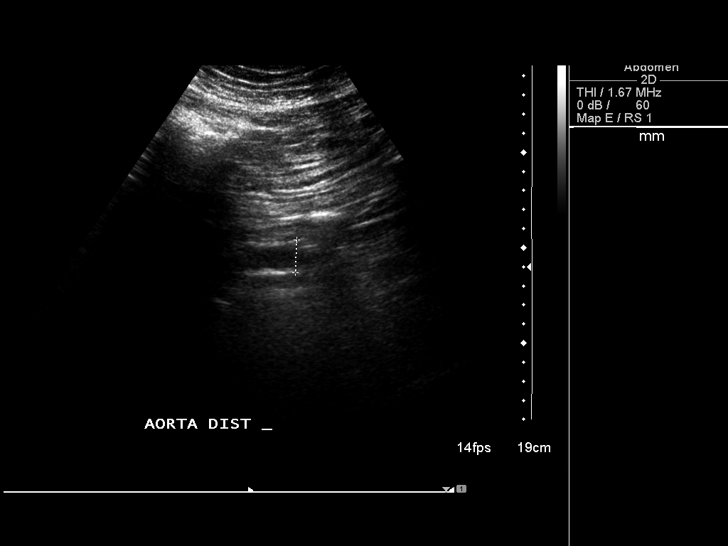
[im 50/80]
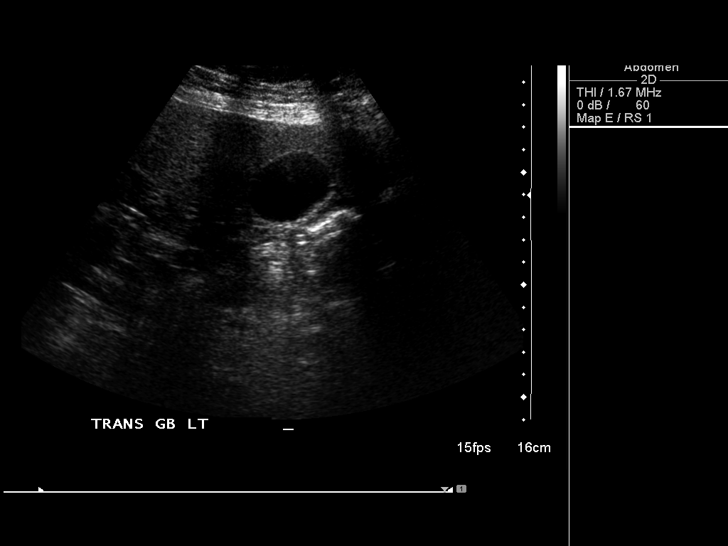
[im 53/80]
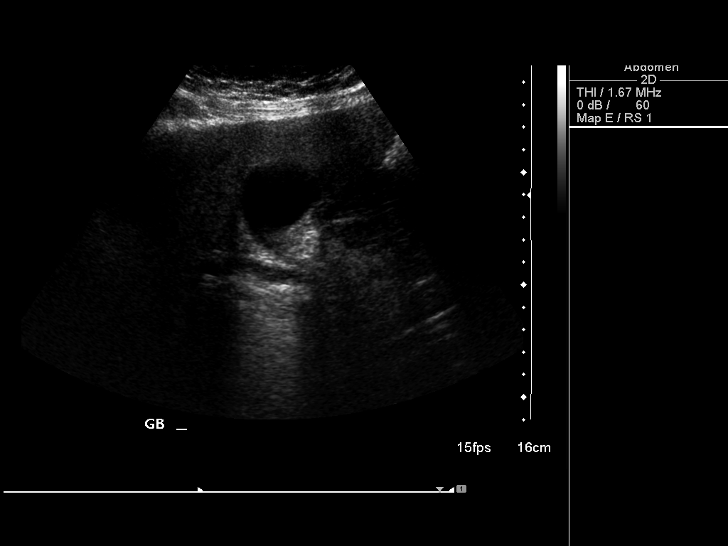
[im 60/80]
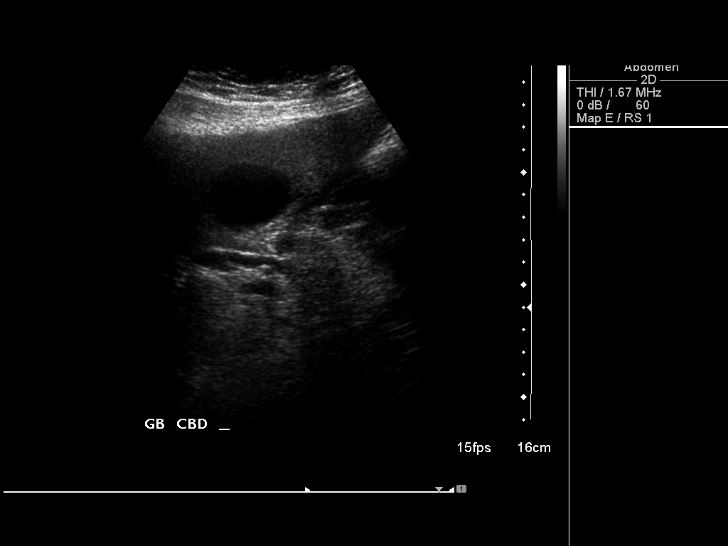
[im 66/80]
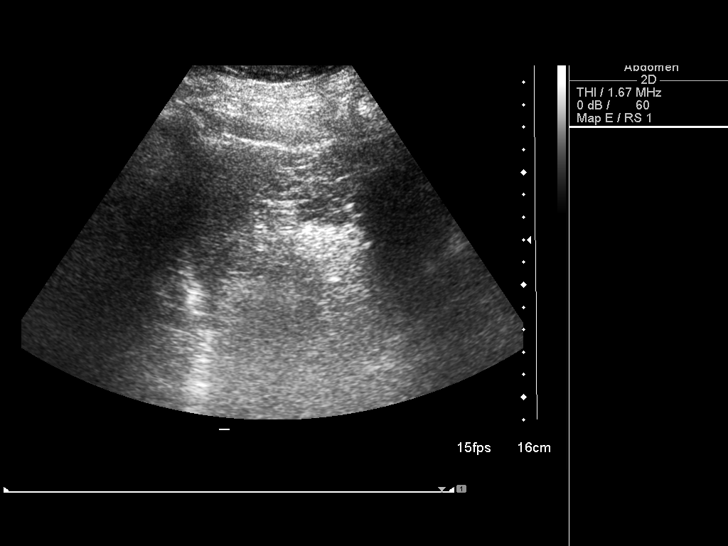
[im 73/80]
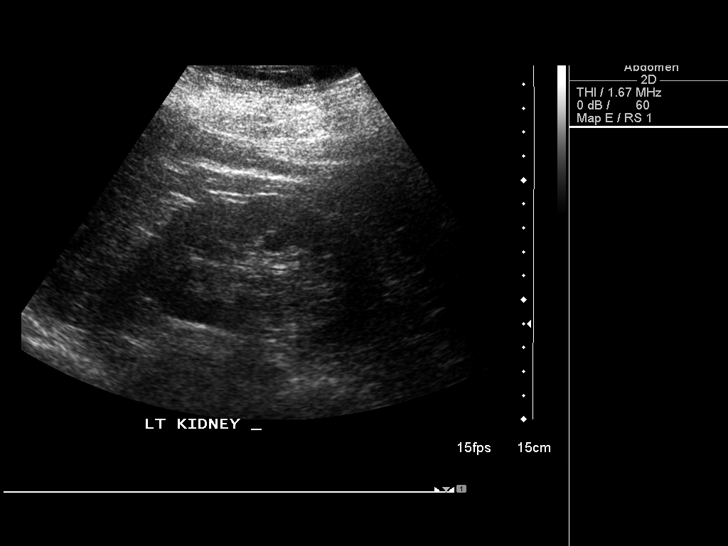
[im 80/80]
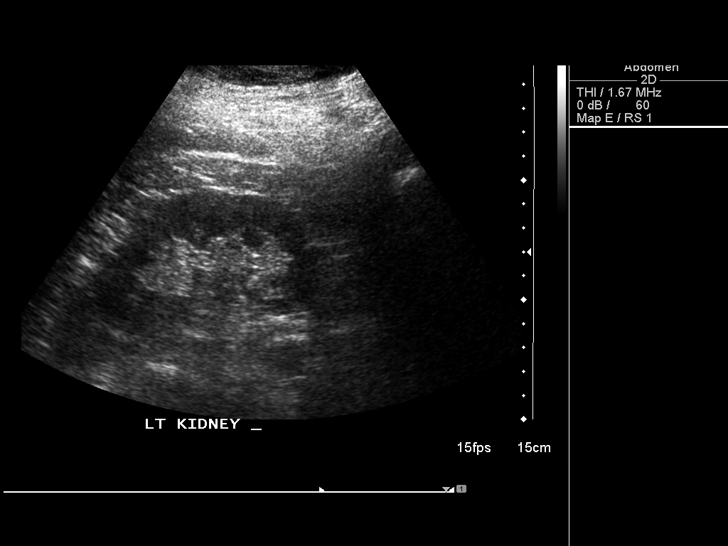

[14 of 25 positions shown; findings below may reference images not displayed]

FINDINGS: Gallbladder: No gallstones or wall thickening visualized. No
sonographic Murphy sign noted by sonographer.

Common bile duct: Diameter: Prominent, measuring 9 mm. No visible
ductal stones.

Liver: Increased echotexture throughout the liver compatible with
fatty infiltration.

IVC: No abnormality visualized.

Pancreas: Visualized portion unremarkable.

Spleen: Size and appearance within normal limits.

Right Kidney: Length: 9.9 cm. Echogenicity within normal limits. No
mass or hydronephrosis visualized.

Left Kidney: Length: 10.5 cm. Echogenicity within normal limits. No
mass or hydronephrosis visualized.

Abdominal aorta: No aneurysm visualized.

Other findings: None.
IMPRESSION: Prominent common bile duct at 9 mm. No visible stones or obstructing
process. The distal duct is difficult to visualize due to overlying
bowel gas. Recommend correlation with LFTs. This could be further
evaluated with MRCP to exclude distal obstructing stone or mass.

## 2018-09-06 DIAGNOSIS — H40022 Open angle with borderline findings, high risk, left eye: Secondary | ICD-10-CM | POA: Diagnosis not present

## 2018-09-06 DIAGNOSIS — H5203 Hypermetropia, bilateral: Secondary | ICD-10-CM | POA: Diagnosis not present

## 2018-09-06 DIAGNOSIS — H25013 Cortical age-related cataract, bilateral: Secondary | ICD-10-CM | POA: Diagnosis not present

## 2018-09-06 DIAGNOSIS — H04123 Dry eye syndrome of bilateral lacrimal glands: Secondary | ICD-10-CM | POA: Diagnosis not present

## 2018-09-06 DIAGNOSIS — H52223 Regular astigmatism, bilateral: Secondary | ICD-10-CM | POA: Diagnosis not present

## 2018-09-06 DIAGNOSIS — H524 Presbyopia: Secondary | ICD-10-CM | POA: Diagnosis not present

## 2018-09-23 ENCOUNTER — Telehealth: Payer: Self-pay | Admitting: Cardiology

## 2018-09-23 NOTE — Telephone Encounter (Signed)
LVM for patient to call and schedule recall appointment with Dr. Harrell Gave.

## 2018-09-30 DIAGNOSIS — I201 Angina pectoris with documented spasm: Secondary | ICD-10-CM | POA: Diagnosis not present

## 2018-09-30 DIAGNOSIS — K219 Gastro-esophageal reflux disease without esophagitis: Secondary | ICD-10-CM | POA: Diagnosis not present

## 2018-09-30 DIAGNOSIS — G4700 Insomnia, unspecified: Secondary | ICD-10-CM | POA: Diagnosis not present

## 2018-09-30 DIAGNOSIS — R7303 Prediabetes: Secondary | ICD-10-CM | POA: Diagnosis not present

## 2018-09-30 DIAGNOSIS — M8580 Other specified disorders of bone density and structure, unspecified site: Secondary | ICD-10-CM | POA: Diagnosis not present

## 2018-09-30 DIAGNOSIS — Z79890 Hormone replacement therapy (postmenopausal): Secondary | ICD-10-CM | POA: Diagnosis not present

## 2018-11-16 ENCOUNTER — Other Ambulatory Visit: Payer: Self-pay

## 2018-11-16 ENCOUNTER — Ambulatory Visit (INDEPENDENT_AMBULATORY_CARE_PROVIDER_SITE_OTHER): Payer: Medicare HMO | Admitting: Cardiology

## 2018-11-16 ENCOUNTER — Encounter: Payer: Self-pay | Admitting: Cardiology

## 2018-11-16 VITALS — BP 132/64 | HR 86 | Ht 62.0 in | Wt 192.0 lb

## 2018-11-16 DIAGNOSIS — I201 Angina pectoris with documented spasm: Secondary | ICD-10-CM | POA: Diagnosis not present

## 2018-11-16 DIAGNOSIS — R0789 Other chest pain: Secondary | ICD-10-CM

## 2018-11-16 DIAGNOSIS — Z7189 Other specified counseling: Secondary | ICD-10-CM | POA: Diagnosis not present

## 2018-11-16 DIAGNOSIS — R079 Chest pain, unspecified: Secondary | ICD-10-CM

## 2018-11-16 NOTE — Patient Instructions (Signed)

## 2018-11-16 NOTE — Progress Notes (Signed)
Cardiology Office Note:    Date:  11/16/2018   ID:  Patty [name], DOB Aug 11, 1940, MRN [id number]  PCP:  Daisy Floro, MD  Cardiologist:  Jodelle Red, MD PhD  Referring MD: Daisy Floro, MD   CC: follow up  History of Present Illness:    Patty [name] is a 78 y.o. female with a hx of coronary vasospasm, MVP, fibromyalgia who is seen as a new patient to me today/prior patient of Dr. Okey Dupre.  Today: Doing well overall. A few concerns: -has history of coronary vasospasm, used to be on the right side and now feeling more at the top of her chest. Started 6-8 months ago, intermittent. Occurs 1-2 times/day. Nonexertional. Lasts a few minutes to 15 minutes max. Does not radiate. No SOB/nausea/diaphoresis. Better with relaxing or taking deep breaths. Feels like an electric shock. -is the pain her heart of fibromyalgia. Discussed tenderness to palpation vs. Cardiac pain. Improves with her nerve pain medication -drools from the right side of her mouth. Is this TIA? Very frequent, no other neuro signs/symptoms -feels like has spiders or another sensation around her mouth on the left side. Not sure what this is.   Reviewed all issues above. Doing well with medications.   Denies heavy chest pain, shortness of breath at rest or with normal exertion. No PND, orthopnea, LE edema or unexpected weight gain. No syncope. Rare palpitations.  Past Medical History:  Diagnosis Date  . Arthritis   . Fibromyalgia   . GERD (gastroesophageal reflux disease)   . Glaucoma   . MVP (mitral valve prolapse)   . Prediabetes   . Prinzmetal angina (HCC) 2005  . Rash of hands     Past Surgical History:  Procedure Laterality Date  . ABDOMINAL HYSTERECTOMY  1975  . CARDIAC CATHETERIZATION  2000   spasm only  . SHOULDER ARTHROSCOPY W/ ROTATOR CUFF REPAIR Bilateral     Current Medications: Current Outpatient Medications on File Prior to Visit  Medication Sig  . aspirin EC 81 MG tablet  Take 1 tablet (81 mg total) by mouth daily.  . B Complex Vitamins (VITAMIN B COMPLEX PO) Take 1 Dose by mouth daily.   . Cholecalciferol (VITAMIN D3) 1000 units CAPS Take 1 capsule by mouth daily.   . Coenzyme Q10 (COQ-10) 100 MG CAPS Take 1 capsule by mouth daily.   Marland Kitchen diltiazem (CARDIZEM CD) 180 MG 24 hr capsule Take 180 mg by mouth daily.  . Doxylamine Succinate, Sleep, (SLEEP AID PO) Take 1 Dose by mouth at bedtime as needed (sleep).   Marland Kitchen estradiol (ESTRACE) 0.5 MG tablet Take 0.5 mg by mouth daily.  Marland Kitchen MAGNESIUM PO Take 133 mg by mouth daily.  . nitroGLYCERIN (NITROSTAT) 0.4 MG SL tablet Place 0.4 mg under the tongue as directed. Place one tablet under the tongue every 5 minutes as needed for chest pain  . omeprazole (PRILOSEC) 20 MG capsule Take 20 mg by mouth daily.  . Saccharomyces boulardii (PROBIOTIC) 250 MG CAPS Take 1 Dose by mouth daily.    No current facility-administered medications on file prior to visit.      Allergies:   Melatonin, Meloxicam, Penicillins, and Tramadol   Social History   Socioeconomic History  . Marital status: Divorced    Spouse name: Not on file  . Number of children: Not on file  . Years of education: Not on file  . Highest education level: Not on file  Occupational History  . Not on file  Social Needs  . Financial resource strain: Not on file  . Food insecurity    Worry: Not on file    Inability: Not on file  . Transportation needs    Medical: Not on file    Non-medical: Not on file  Tobacco Use  . Smoking status: Former Smoker    Packs/day: 1.00    Years: 30.00    Pack years: 30.00    Quit date: 1989    Years since quitting: 31.6  . Smokeless tobacco: Never Used  Substance and Sexual Activity  . Alcohol use: No    Comment: social; glass of wine once very 2-3 months  . Drug use: No  . Sexual activity: Not on file  Lifestyle  . Physical activity    Days per week: Not on file    Minutes per session: Not on file  . Stress: Not on  file  Relationships  . Social Musicianconnections    Talks on phone: Not on file    Gets together: Not on file    Attends religious service: Not on file    Active member of club or organization: Not on file    Attends meetings of clubs or organizations: Not on file    Relationship status: Not on file  Other Topics Concern  . Not on file  Social History Narrative  . Not on file     Family History: The patient's family history includes Alzheimer's disease (age of onset: 5386) in her father; Healthy (age of onset: 1190) in her mother.  ROS:   Please see the history of present illness.  Additional pertinent ROS: Constitutional: Negative for chills, fever, night sweats, unintentional weight loss  HENT: Negative for ear pain and hearing loss.   Eyes: Negative for loss of vision and eye pain.  Respiratory: Negative for cough, sputum, wheezing.   Cardiovascular: See HPI. Gastrointestinal: Negative for abdominal pain, melena, and hematochezia.  Genitourinary: Negative for dysuria and hematuria.  Musculoskeletal: Negative for falls and myalgias.  Skin: Negative for itching and rash.  Neurological: Negative for focal weakness, focal sensory changes and loss of consciousness.  Endo/Heme/Allergies: Does not bruise/bleed easily.     EKGs/Labs/Other Studies Reviewed:    The following studies were reviewed today: Cath/PCI:  LHC (01/23/99): LMCA normal. LAD normal. LCx normal. Dominant RCA with ostial vasospasm that improved after intracoronary NTG. No significant atherosclerotic disease. Normal LV contraction with mildly elevated LVEDP (23 mmHg). Non-Invasive Evaluation(s):  Transthoracic echocardiogram (03/14/16): Normal LV size and wall thickness with an EF of 60-65%. Normal wall motion. Grade 1 diastolic dysfunction. Mild left atrial enlargement. Normal RV size and function. No significant valvular abnormalities.  Pharmacologic myocardial perfusion stress test (03/14/16): Normal study without  evidence of ischemia or infarction. LVEF greater than 65%.  EKG:  EKG is personally reviewed.  The ekg ordered today demonstrates NSR, low voltage precordial leads  Recent Labs: No results found for requested labs within last 8760 hours.  Recent Lipid Panel    Component Value Date/Time   CHOL 203 (H) 01/25/2016 1633   TRIG 144 01/25/2016 1633   HDL 67 01/25/2016 1633   CHOLHDL 3.0 01/25/2016 1633   VLDL 29 01/25/2016 1633   LDLCALC 107 (H) 01/25/2016 1633    Physical Exam:    VS:  BP 132/64   Pulse 86   Ht 5\' 2"  (1.575 m)   Wt 192 lb (87.1 kg)   SpO2 96%   BMI 35.12 kg/m     Wt Readings  from Last 3 Encounters:  11/16/18 192 lb (87.1 kg)  03/28/16 191 lb 9.6 oz (86.9 kg)  01/25/16 194 lb (88 kg)     GEN: Well nourished, well developed in no acute distress HEENT: Normal, moist mucous membranes NECK: No JVD CARDIAC: regular rhythm, normal S1 and S2, no murmurs, rubs, gallops.  VASCULAR: Radial and DP pulses 2+ bilaterally. No carotid bruits RESPIRATORY:  Clear to auscultation without rales, wheezing or rhonchi  ABDOMEN: Soft, non-tender, non-distended MUSCULOSKELETAL:  Ambulates independently SKIN: Warm and dry, no edema NEUROLOGIC:  Alert and oriented x 3. No focal neuro deficits noted. PSYCHIATRIC:  Normal affect    ASSESSMENT:    1. Coronary vasospasm (HCC)   2. Chest pain of uncertain etiology   3. Cardiac risk counseling   4. Counseling on health promotion and disease prevention    PLAN:    History of coronary vasospasm, current chest pain: -chest pain she is currently having not consistent with angina. Has been evaluated for atypical chest pain in the past with negative stress test. I did counsel that I cannot definitively rule out a cardiac etiology, but tenderness to palpation/electric sensation may be more consistent with her fibromyalgia that spasm. -I counseled on red flag warning signs that need immediate medical attention  Cardiac risk counseling  and prevention recommendations: -recommend heart healthy/Mediterranean diet, with whole grains, fruits, vegetable, fish, lean meats, nuts, and olive oil. Limit salt. -recommend moderate walking, 3-5 times/week for 30-50 minutes each session. Aim for at least 150 minutes.week. Goal should be pace of 3 miles/hours, or walking 1.5 miles in 30 minutes -recommend avoidance of tobacco products. Avoid excess alcohol. -Weight: BMI 35, would benefit from weight loss. Trying to return to activity -ASCVD risk score: The 10-year ASCVD risk score Denman George([place]off [place] Montez HagemanJr., et al., 2013) is: 21.2%   Values used to calculate the score:     Age: [age] years     Sex: Female     Is Non-Hispanic African American: No     Diabetic: No     Tobacco smoker: No     Systolic Blood Pressure: 132 mmHg     Is BP treated: No     HDL Cholesterol: 67 mg/dL     Total Cholesterol: 203 mg/dL    Plan for follow up: 1 year or sooner PRN  Medication Adjustments/Labs and Tests Ordered: Current medicines are reviewed at length with the patient today.  Concerns regarding medicines are outlined above.  Orders Placed This Encounter  Procedures  . EKG 12-Lead   No orders of the defined types were placed in this encounter.   Patient Instructions  Medication Instructions:  Your Physician recommend you continue on your current medication as directed.    If you need a refill on your cardiac medications before your next appointment, please call your pharmacy.   Lab work: None  Testing/Procedures: None  Follow-Up: At [facility], you and your health needs are our priority.  As part of our continuing mission to provide you with exceptional heart care, we have created designated Provider Care Teams.  These Care Teams include your primary Cardiologist (physician) and Advanced Practice Providers (APPs -  Physician Assistants and Nurse Practitioners) who all work together to provide you with the care you need, when you need it. You will  need a follow up appointment in 1 years.  Please call our office 2 months in advance to schedule this appointment.  You may see Dr. Cristal Deerhristopher or one of the following  Advanced Practice Providers on your designated Care Team:   Rosaria Ferries, PA-C . Jory Sims, DNP, ANP        Signed, Buford Dresser, MD PhD 11/16/2018 6:57 PM    [place]

## 2019-04-19 DIAGNOSIS — M79644 Pain in right finger(s): Secondary | ICD-10-CM | POA: Diagnosis not present

## 2019-06-16 DIAGNOSIS — U071 COVID-19: Secondary | ICD-10-CM | POA: Diagnosis not present

## 2019-08-03 DIAGNOSIS — D72829 Elevated white blood cell count, unspecified: Secondary | ICD-10-CM | POA: Diagnosis not present

## 2019-08-03 DIAGNOSIS — Z79890 Hormone replacement therapy (postmenopausal): Secondary | ICD-10-CM | POA: Diagnosis not present

## 2019-08-03 DIAGNOSIS — M8580 Other specified disorders of bone density and structure, unspecified site: Secondary | ICD-10-CM | POA: Diagnosis not present

## 2019-08-03 DIAGNOSIS — M1990 Unspecified osteoarthritis, unspecified site: Secondary | ICD-10-CM | POA: Diagnosis not present

## 2019-08-03 DIAGNOSIS — R5381 Other malaise: Secondary | ICD-10-CM | POA: Diagnosis not present

## 2019-08-03 DIAGNOSIS — R7303 Prediabetes: Secondary | ICD-10-CM | POA: Diagnosis not present

## 2019-08-03 DIAGNOSIS — K219 Gastro-esophageal reflux disease without esophagitis: Secondary | ICD-10-CM | POA: Diagnosis not present

## 2019-08-03 DIAGNOSIS — J449 Chronic obstructive pulmonary disease, unspecified: Secondary | ICD-10-CM | POA: Diagnosis not present

## 2019-08-03 DIAGNOSIS — Z79899 Other long term (current) drug therapy: Secondary | ICD-10-CM | POA: Diagnosis not present

## 2019-08-03 DIAGNOSIS — I201 Angina pectoris with documented spasm: Secondary | ICD-10-CM | POA: Diagnosis not present

## 2019-12-19 DIAGNOSIS — M25552 Pain in left hip: Secondary | ICD-10-CM | POA: Diagnosis not present

## 2020-03-24 DIAGNOSIS — M67912 Unspecified disorder of synovium and tendon, left shoulder: Secondary | ICD-10-CM | POA: Diagnosis not present

## 2020-04-13 DIAGNOSIS — M67912 Unspecified disorder of synovium and tendon, left shoulder: Secondary | ICD-10-CM | POA: Diagnosis not present

## 2020-05-11 ENCOUNTER — Ambulatory Visit (INDEPENDENT_AMBULATORY_CARE_PROVIDER_SITE_OTHER): Payer: Medicare HMO | Admitting: Cardiology

## 2020-05-11 ENCOUNTER — Encounter: Payer: Self-pay | Admitting: Cardiology

## 2020-05-11 ENCOUNTER — Other Ambulatory Visit: Payer: Self-pay

## 2020-05-11 VITALS — BP 144/72 | HR 76 | Ht 62.0 in | Wt 191.0 lb

## 2020-05-11 DIAGNOSIS — I201 Angina pectoris with documented spasm: Secondary | ICD-10-CM

## 2020-05-11 DIAGNOSIS — Z7189 Other specified counseling: Secondary | ICD-10-CM | POA: Diagnosis not present

## 2020-05-11 DIAGNOSIS — R079 Chest pain, unspecified: Secondary | ICD-10-CM

## 2020-05-11 NOTE — Patient Instructions (Signed)

## 2020-05-11 NOTE — Progress Notes (Signed)
Cardiology Office Note:    Date:  05/11/2020   ID:  JARELIS EHLERT, DOB [birth year], MRN [id number]  PCP:  Daisy Floro, MD  Cardiologist:  Jodelle Red, MD PhD  Referring MD: Daisy Floro, MD   CC: follow up  History of Present Illness:    Patty [name] is a 80 y.o. female with a hx of coronary vasospasm, MVP, fibromyalgia who is seen as a new patient to me today/prior patient of Dr. Okey Dupre.  Today: Feels like spasms or other causes of chest pain are getting worse over the last 6 mos. Has gotten more severe. Not exertional, can be in any position. Happens 3-4 days/week. Lasts about less than 5 minutes. No syncope. Goes away on their own. Central chest, nonradiating. No associated symptoms. Hasn't tried nitro, afraid to as they make her very lightheaded.   We spent time discussing chest pain, possible etiologies, workup at length today, see below.  Denies shortness of breath at rest or with normal exertion. No PND, orthopnea, LE edema or unexpected weight gain. No syncope or palpitations.  Past Medical History:  Diagnosis Date  . Arthritis   . Fibromyalgia   . GERD (gastroesophageal reflux disease)   . Glaucoma   . MVP (mitral valve prolapse)   . Prediabetes   . Prinzmetal angina (HCC) 2005  . Rash of hands     Past Surgical History:  Procedure Laterality Date  . ABDOMINAL HYSTERECTOMY  1975  . CARDIAC CATHETERIZATION  2000   spasm only  . SHOULDER ARTHROSCOPY W/ ROTATOR CUFF REPAIR Bilateral     Current Medications: Current Outpatient Medications on File Prior to Visit  Medication Sig  . aspirin EC 81 MG tablet Take 1 tablet (81 mg total) by mouth daily. (Patient taking differently: Take 81 mg by mouth daily. As needed)  . B Complex Vitamins (VITAMIN B COMPLEX PO) Take 1 Dose by mouth daily.   . Cholecalciferol (VITAMIN D3) 1000 units CAPS Take 1 capsule by mouth daily.   Marland Kitchen diltiazem (CARDIZEM CD) 180 MG 24 hr capsule Take 180 mg by mouth daily.   . Doxylamine Succinate, Sleep, (SLEEP AID PO) Take 50 mg by mouth at bedtime as needed (sleep). 2 at Bedtime  . estradiol (ESTRACE) 0.5 MG tablet Take 0.5 mg by mouth daily.  Marland Kitchen MAGNESIUM PO Take 133 mg by mouth daily.  . nitroGLYCERIN (NITROSTAT) 0.4 MG SL tablet Place 0.4 mg under the tongue as directed. Place one tablet under the tongue every 5 minutes as needed for chest pain  . omeprazole (PRILOSEC) 20 MG capsule Take 20 mg by mouth daily.  . Saccharomyces boulardii (PROBIOTIC) 250 MG CAPS Take 1 Dose by mouth daily.    No current facility-administered medications on file prior to visit.     Allergies:   Melatonin, Meloxicam, Penicillins, and Tramadol   Social History   Tobacco Use  . Smoking status: Former Smoker    Packs/day: 1.00    Years: 30.00    Pack years: 30.00    Quit date: 1989    Years since quitting: 33.1  . Smokeless tobacco: Never Used  Substance Use Topics  . Alcohol use: No    Comment: social; glass of wine once very 2-3 months  . Drug use: No    Family History: The patient's family history includes Alzheimer's disease (age of onset: 80) in her father; Healthy (age of onset: 35) in her mother.  ROS:   Please see the history of present  illness.  Additional pertinent ROS otherwise unremarkable.  EKGs/Labs/Other Studies Reviewed:    The following studies were reviewed today: Cath/PCI:  LHC (01/23/99): LMCA normal. LAD normal. LCx normal. Dominant RCA with ostial vasospasm that improved after intracoronary NTG. No significant atherosclerotic disease. Normal LV contraction with mildly elevated LVEDP (23 mmHg). Non-Invasive Evaluation(s):  Transthoracic echocardiogram (03/14/16): Normal LV size and wall thickness with an EF of 60-65%. Normal wall motion. Grade 1 diastolic dysfunction. Mild left atrial enlargement. Normal RV size and function. No significant valvular abnormalities.  Pharmacologic myocardial perfusion stress test (03/14/16): Normal study  without evidence of ischemia or infarction. LVEF greater than 65%.  EKG:  EKG is personally reviewed.  The ekg ordered today demonstrates NSR at 76 bpm, low voltage precordial leads  Recent Labs: No results found for requested labs within last 8760 hours.  Recent Lipid Panel    Component Value Date/Time   CHOL 203 (H) 01/25/2016 1633   TRIG 144 01/25/2016 1633   HDL 67 01/25/2016 1633   CHOLHDL 3.0 01/25/2016 1633   VLDL 29 01/25/2016 1633   LDLCALC 107 (H) 01/25/2016 1633    Physical Exam:    VS:  BP (!) 144/72   Pulse 76   Ht 5\' 2"  (1.575 m)   Wt 191 lb (86.6 kg)   BMI 34.93 kg/m     Wt Readings from Last 3 Encounters:  05/11/20 191 lb (86.6 kg)  11/16/18 192 lb (87.1 kg)  03/28/16 191 lb 9.6 oz (86.9 kg)    GEN: Well nourished, well developed in no acute distress HEENT: Normal, moist mucous membranes NECK: No JVD CARDIAC: regular rhythm, normal S1 and S2, no rubs or gallops. No murmur. She does have several areas of tenderness to palpation across her chest VASCULAR: Radial and DP pulses 2+ bilaterally. No carotid bruits RESPIRATORY:  Clear to auscultation without rales, wheezing or rhonchi  ABDOMEN: Soft, non-tender, non-distended MUSCULOSKELETAL:  Ambulates independently SKIN: Warm and dry, no edema NEUROLOGIC:  Alert and oriented x 3. No focal neuro deficits noted. PSYCHIATRIC:  Normal affect    ASSESSMENT:    1. Chest pain of uncertain etiology   2. Coronary vasospasm (HCC)   3. Cardiac risk counseling   4. Counseling on health promotion and disease prevention    PLAN:    History of coronary vasospasm, current chest pain: -this is similar to prior pattern. Nonexertional, mild tenderness to palpation on chest -Has been evaluated for atypical chest pain in the past with negative stress test.  -we discussed possible etiologies of chest pain at length today. I did counsel that I cannot definitively rule out a cardiac etiology, but tenderness to palpation may  be more consistent with her fibromyalgia that spasm. She will continue to monitor symptoms -I counseled on red flag warning signs that need immediate medical attention  Cardiac risk counseling and prevention recommendations: -recommend heart healthy/Mediterranean diet, with whole grains, fruits, vegetable, fish, lean meats, nuts, and olive oil. Limit salt. -recommend moderate walking, 3-5 times/week for 30-50 minutes each session. Aim for at least 150 minutes.week. Goal should be pace of 3 miles/hours, or walking 1.5 miles in 30 minutes -recommend avoidance of tobacco products. Avoid excess alcohol. -Weight: BMI 35, would benefit from weight loss. Trying to return to activity -ASCVD risk score: The ASCVD Risk score 05/26/16 DC Jr., et al., 2013) failed to calculate for the following reasons:   Cannot find a previous HDL lab   Cannot find a previous total cholesterol lab  Plan for follow up: 6 mos or sooner PRN  Medication Adjustments/Labs and Tests Ordered: Current medicines are reviewed at length with the patient today.  Concerns regarding medicines are outlined above.  Orders Placed This Encounter  Procedures  . EKG 12-Lead   No orders of the defined types were placed in this encounter.   Patient Instructions  Medication Instructions:  Your Physician recommend you continue on your current medication as directed.    *If you need a refill on your cardiac medications before your next appointment, please call your pharmacy*   Lab Work: None   Testing/Procedures: None   Follow-Up: At [facility], you and your health needs are our priority.  As part of our continuing mission to provide you with exceptional heart care, we have created designated Provider Care Teams.  These Care Teams include your primary Cardiologist (physician) and Advanced Practice Providers (APPs -  Physician Assistants and Nurse Practitioners) who all work together to provide you with the care you need, when  you need it.  We recommend signing up for the patient portal called "MyChart".  Sign up information is provided on this After Visit Summary.  MyChart is used to connect with patients for Virtual Visits (Telemedicine).  Patients are able to view lab/test results, encounter notes, upcoming appointments, etc.  Non-urgent messages can be sent to your provider as well.   To learn more about what you can do with MyChart, go to [email or web address].    Your next appointment:   6 month(s)  The format for your next appointment:   In Person  Provider:   Jodelle Red, MD       Signed, Jodelle Red, MD PhD 05/11/2020   [facility]

## 2020-05-13 ENCOUNTER — Encounter: Payer: Self-pay | Admitting: Cardiology

## 2020-05-24 DIAGNOSIS — H25013 Cortical age-related cataract, bilateral: Secondary | ICD-10-CM | POA: Diagnosis not present

## 2020-05-24 DIAGNOSIS — H5203 Hypermetropia, bilateral: Secondary | ICD-10-CM | POA: Diagnosis not present

## 2020-05-24 DIAGNOSIS — H524 Presbyopia: Secondary | ICD-10-CM | POA: Diagnosis not present

## 2020-05-24 DIAGNOSIS — H04123 Dry eye syndrome of bilateral lacrimal glands: Secondary | ICD-10-CM | POA: Diagnosis not present

## 2020-05-24 DIAGNOSIS — H52223 Regular astigmatism, bilateral: Secondary | ICD-10-CM | POA: Diagnosis not present

## 2020-05-24 DIAGNOSIS — H40022 Open angle with borderline findings, high risk, left eye: Secondary | ICD-10-CM | POA: Diagnosis not present

## 2020-07-23 DIAGNOSIS — H25013 Cortical age-related cataract, bilateral: Secondary | ICD-10-CM | POA: Diagnosis not present

## 2020-07-23 DIAGNOSIS — H40022 Open angle with borderline findings, high risk, left eye: Secondary | ICD-10-CM | POA: Diagnosis not present

## 2020-07-23 DIAGNOSIS — H04123 Dry eye syndrome of bilateral lacrimal glands: Secondary | ICD-10-CM | POA: Diagnosis not present

## 2020-08-30 DIAGNOSIS — H04123 Dry eye syndrome of bilateral lacrimal glands: Secondary | ICD-10-CM | POA: Diagnosis not present

## 2020-08-30 DIAGNOSIS — H40022 Open angle with borderline findings, high risk, left eye: Secondary | ICD-10-CM | POA: Diagnosis not present

## 2020-08-30 DIAGNOSIS — H25013 Cortical age-related cataract, bilateral: Secondary | ICD-10-CM | POA: Diagnosis not present

## 2020-09-10 DIAGNOSIS — Z6836 Body mass index (BMI) 36.0-36.9, adult: Secondary | ICD-10-CM | POA: Diagnosis not present

## 2020-09-10 DIAGNOSIS — H409 Unspecified glaucoma: Secondary | ICD-10-CM | POA: Diagnosis not present

## 2020-09-12 DIAGNOSIS — M47816 Spondylosis without myelopathy or radiculopathy, lumbar region: Secondary | ICD-10-CM | POA: Diagnosis not present

## 2020-10-22 DIAGNOSIS — H04123 Dry eye syndrome of bilateral lacrimal glands: Secondary | ICD-10-CM | POA: Diagnosis not present

## 2020-10-22 DIAGNOSIS — H25813 Combined forms of age-related cataract, bilateral: Secondary | ICD-10-CM | POA: Diagnosis not present

## 2020-10-22 DIAGNOSIS — H401124 Primary open-angle glaucoma, left eye, indeterminate stage: Secondary | ICD-10-CM | POA: Diagnosis not present

## 2020-10-22 DIAGNOSIS — H40001 Preglaucoma, unspecified, right eye: Secondary | ICD-10-CM | POA: Diagnosis not present

## 2020-11-07 DIAGNOSIS — M79672 Pain in left foot: Secondary | ICD-10-CM | POA: Diagnosis not present

## 2020-11-07 DIAGNOSIS — Z6834 Body mass index (BMI) 34.0-34.9, adult: Secondary | ICD-10-CM | POA: Diagnosis not present

## 2020-11-13 ENCOUNTER — Ambulatory Visit (HOSPITAL_BASED_OUTPATIENT_CLINIC_OR_DEPARTMENT_OTHER): Payer: Medicare HMO | Admitting: Cardiology

## 2020-12-24 DIAGNOSIS — H40001 Preglaucoma, unspecified, right eye: Secondary | ICD-10-CM | POA: Diagnosis not present

## 2020-12-24 DIAGNOSIS — J449 Chronic obstructive pulmonary disease, unspecified: Secondary | ICD-10-CM | POA: Diagnosis not present

## 2020-12-24 DIAGNOSIS — H401124 Primary open-angle glaucoma, left eye, indeterminate stage: Secondary | ICD-10-CM | POA: Diagnosis not present

## 2020-12-24 DIAGNOSIS — H04123 Dry eye syndrome of bilateral lacrimal glands: Secondary | ICD-10-CM | POA: Diagnosis not present

## 2020-12-24 DIAGNOSIS — H25813 Combined forms of age-related cataract, bilateral: Secondary | ICD-10-CM | POA: Diagnosis not present

## 2020-12-24 DIAGNOSIS — K5090 Crohn's disease, unspecified, without complications: Secondary | ICD-10-CM | POA: Diagnosis not present

## 2021-01-14 DIAGNOSIS — H25813 Combined forms of age-related cataract, bilateral: Secondary | ICD-10-CM | POA: Diagnosis not present

## 2021-01-14 DIAGNOSIS — M1611 Unilateral primary osteoarthritis, right hip: Secondary | ICD-10-CM | POA: Diagnosis not present

## 2021-01-14 DIAGNOSIS — M7061 Trochanteric bursitis, right hip: Secondary | ICD-10-CM | POA: Diagnosis not present

## 2021-01-14 DIAGNOSIS — H04123 Dry eye syndrome of bilateral lacrimal glands: Secondary | ICD-10-CM | POA: Diagnosis not present

## 2021-01-14 DIAGNOSIS — H40001 Preglaucoma, unspecified, right eye: Secondary | ICD-10-CM | POA: Diagnosis not present

## 2021-01-14 DIAGNOSIS — H401124 Primary open-angle glaucoma, left eye, indeterminate stage: Secondary | ICD-10-CM | POA: Diagnosis not present

## 2021-01-14 DIAGNOSIS — M25551 Pain in right hip: Secondary | ICD-10-CM | POA: Diagnosis not present

## 2021-02-05 DIAGNOSIS — H401124 Primary open-angle glaucoma, left eye, indeterminate stage: Secondary | ICD-10-CM | POA: Diagnosis not present

## 2021-02-05 DIAGNOSIS — H04123 Dry eye syndrome of bilateral lacrimal glands: Secondary | ICD-10-CM | POA: Diagnosis not present

## 2021-02-05 DIAGNOSIS — H25813 Combined forms of age-related cataract, bilateral: Secondary | ICD-10-CM | POA: Diagnosis not present

## 2021-02-05 DIAGNOSIS — H40001 Preglaucoma, unspecified, right eye: Secondary | ICD-10-CM | POA: Diagnosis not present

## 2021-04-03 DIAGNOSIS — R079 Chest pain, unspecified: Secondary | ICD-10-CM | POA: Diagnosis not present

## 2021-04-03 DIAGNOSIS — M7060 Trochanteric bursitis, unspecified hip: Secondary | ICD-10-CM | POA: Diagnosis not present

## 2021-04-03 DIAGNOSIS — Z79899 Other long term (current) drug therapy: Secondary | ICD-10-CM | POA: Diagnosis not present

## 2021-04-03 DIAGNOSIS — R5383 Other fatigue: Secondary | ICD-10-CM | POA: Diagnosis not present

## 2021-04-22 DIAGNOSIS — H40051 Ocular hypertension, right eye: Secondary | ICD-10-CM | POA: Diagnosis not present

## 2021-04-30 ENCOUNTER — Ambulatory Visit (HOSPITAL_BASED_OUTPATIENT_CLINIC_OR_DEPARTMENT_OTHER): Payer: Medicare HMO | Admitting: Cardiology

## 2021-04-30 ENCOUNTER — Other Ambulatory Visit: Payer: Self-pay

## 2021-04-30 ENCOUNTER — Encounter (HOSPITAL_BASED_OUTPATIENT_CLINIC_OR_DEPARTMENT_OTHER): Payer: Self-pay | Admitting: Cardiology

## 2021-04-30 VITALS — BP 128/64 | HR 66 | Ht 61.0 in | Wt 181.8 lb

## 2021-04-30 DIAGNOSIS — I201 Angina pectoris with documented spasm: Secondary | ICD-10-CM | POA: Diagnosis not present

## 2021-04-30 DIAGNOSIS — R079 Chest pain, unspecified: Secondary | ICD-10-CM | POA: Diagnosis not present

## 2021-04-30 DIAGNOSIS — Z7189 Other specified counseling: Secondary | ICD-10-CM

## 2021-04-30 NOTE — Progress Notes (Signed)
Cardiology Office Note:    Date:  04/30/2021   ID:  Patty [name], DOB March 11, 1941, MRN [id number]  PCP:  Daisy Floro, MD  Cardiologist:  Jodelle Red, MD PhD  Referring MD: Daisy Floro, MD   CC: follow up  History of Present Illness:    Patty [name] is a 81 y.o. female with a hx of coronary vasospasm, MVP, fibromyalgia who is seen for follow-up. She was initially as a new patient to me on 05/11/20 as a prior patient of Dr. Okey Dupre.  Today: She is doing well. However, in the past 5 months, She has been having spasms and palpitations. She was supposed to move house earlier but was not able to. She is now in the process of trying to move to [place] again. She was given medicine to manage the stress. These episodes can last up to 30 minutes and occur randomly throughout the day. She describes the sensation as being electrocuted in her central chest but they are not painful. However, they do cause her to pause and rest. Palpating the area alleviates the sensation. Since starting the medications, the episodes have been shorter and less frequent.   Since her last visit, she has had 2 falls. IN both instances, she was trying to take things outside of her house and she tripped in the doorway.  She reports feeling lethargic and cold for sometime. She saw Dr. Tenny Craw last month. He told her that she was anemic and she was started on iron supplements.   She endorses cramps and pain in her bilateral LE, primarily in her thighs. Potassium and warm compress improves the pain.   Denies shortness of breath at rest or with normal exertion. No PND, orthopnea, LE edema or unexpected weight gain. No syncope.  Past Medical History:  Diagnosis Date   Arthritis    Fibromyalgia    GERD (gastroesophageal reflux disease)    Glaucoma    MVP (mitral valve prolapse)    Prediabetes    Prinzmetal angina (HCC) 2005   Rash of hands     Past Surgical History:  Procedure Laterality Date    ABDOMINAL HYSTERECTOMY  1975   CARDIAC CATHETERIZATION  2000   spasm only   SHOULDER ARTHROSCOPY W/ ROTATOR CUFF REPAIR Bilateral     Current Medications: Current Outpatient Medications on File Prior to Visit  Medication Sig   aspirin EC 81 MG tablet Take 1 tablet (81 mg total) by mouth daily. (Patient taking differently: Take 81 mg by mouth daily. As needed)   B Complex Vitamins (VITAMIN B COMPLEX PO) Take 1 Dose by mouth daily.    Cholecalciferol (VITAMIN D3) 1000 units CAPS Take 1 capsule by mouth daily.    diltiazem (CARDIZEM CD) 180 MG 24 hr capsule Take 180 mg by mouth daily.   Doxylamine Succinate, Sleep, (SLEEP AID PO) Take 50 mg by mouth at bedtime as needed (sleep). 2 at Bedtime   estradiol (ESTRACE) 0.5 MG tablet Take 0.5 mg by mouth daily.   ferrous sulfate 325 (65 FE) MG tablet Take 1 tablet by mouth daily.   MAGNESIUM PO Take 133 mg by mouth daily.   nitroGLYCERIN (NITROSTAT) 0.4 MG SL tablet Place 0.4 mg under the tongue as directed. Place one tablet under the tongue every 5 minutes as needed for chest pain   omeprazole (PRILOSEC) 20 MG capsule Take 20 mg by mouth daily.   Saccharomyces boulardii (PROBIOTIC) 250 MG CAPS Take 1 Dose by mouth daily.  No current facility-administered medications on file prior to visit.     Allergies:   Melatonin, Meloxicam, Penicillins, and Tramadol   Social History   Tobacco Use   Smoking status: Former    Packs/day: 1.00    Years: 30.00    Pack years: 30.00    Types: Cigarettes    Quit date: 1989    Years since quitting: 34.1   Smokeless tobacco: Never  Substance Use Topics   Alcohol use: No    Comment: social; glass of wine once very 2-3 months   Drug use: No    Family History: The patient's family history includes Alzheimer's disease (age of onset: 65) in her father; Healthy (age of onset: 31) in her mother.  ROS:   Please see the history of present illness.   (+) Spasms (+) Palpitations (+) Falls (+) Lethargy (+)  Chills (+) Bilateral LE cramps/pain Additional pertinent ROS otherwise unremarkable.  EKGs/Labs/Other Studies Reviewed:    The following studies were reviewed today: Cath/PCI: LHC (01/23/99): LMCA normal. LAD normal. LCx normal. Dominant RCA with ostial vasospasm that improved after intracoronary NTG. No significant atherosclerotic disease. Normal LV contraction with mildly elevated LVEDP (23 mmHg). Non-Invasive Evaluation(s): Transthoracic echocardiogram (03/14/16): Normal LV size and wall thickness with an EF of 60-65%. Normal wall motion. Grade 1 diastolic dysfunction. Mild left atrial enlargement. Normal RV size and function. No significant valvular abnormalities. Pharmacologic myocardial perfusion stress test (03/14/16): Normal study without evidence of ischemia or infarction. LVEF greater than 65%.  EKG:  EKG is personally reviewed.  04/30/21: NSR at 66 bpm, low voltage precordial leads 05/11/20: NSR at 76 bpm, low voltage precordial leads  Recent Labs: No results found for requested labs within last 8760 hours.  Recent Lipid Panel    Component Value Date/Time   CHOL 203 (H) 01/25/2016 1633   TRIG 144 01/25/2016 1633   HDL 67 01/25/2016 1633   CHOLHDL 3.0 01/25/2016 1633   VLDL 29 01/25/2016 1633   LDLCALC 107 (H) 01/25/2016 1633    Physical Exam:    VS:  BP 128/64    Pulse 66    Ht 5\' 1"  (1.549 m)    Wt 181 lb 12.8 oz (82.5 kg)    SpO2 96%    BMI 34.35 kg/m     Wt Readings from Last 3 Encounters:  04/30/21 181 lb 12.8 oz (82.5 kg)  05/11/20 191 lb (86.6 kg)  11/16/18 192 lb (87.1 kg)    GEN: Well nourished, well developed in no acute distress HEENT: Normal, moist mucous membranes NECK: No JVD CARDIAC: regular rhythm, normal S1 and S2, no rubs or gallops. No murmur. Mild tenderness to palpation of chest VASCULAR: Radial and DP pulses 2+ bilaterally. No carotid bruits RESPIRATORY:  Clear to auscultation without rales, wheezing or rhonchi  ABDOMEN: Soft, non-tender,  non-distended MUSCULOSKELETAL:  Ambulates independently SKIN: Warm and dry, no edema NEUROLOGIC:  Alert and oriented x 3. No focal neuro deficits noted. PSYCHIATRIC:  Normal affect    ASSESSMENT:    1. Chest pain of uncertain etiology   2. Coronary vasospasm (HCC)   3. Cardiac risk counseling   4. Counseling on health promotion and disease prevention     PLAN:    History of coronary vasospasm, atypical chest pain: -this is similar to prior pattern. Nonexertional, mild tenderness to palpation on chest, electric sensation -Has been evaluated for atypical chest pain in the past with negative stress test.  -she will try hot pack to see if  this helps with her symptoms -I counseled on red flag warning signs that need immediate medical attention  Cardiac risk counseling and prevention recommendations: -recommend heart healthy/Mediterranean diet, with whole grains, fruits, vegetable, fish, lean meats, nuts, and olive oil. Limit salt. -recommend moderate walking, 3-5 times/week for 30-50 minutes each session. Aim for at least 150 minutes.week. Goal should be pace of 3 miles/hours, or walking 1.5 miles in 30 minutes -recommend avoidance of tobacco products. Avoid excess alcohol. -Weight: BMI 35, would benefit from weight loss. Trying to return to activity  Plan for follow up: No follow-up due to moving in the next several months  Jodelle Red, MD, PhD, [facility]   [facility]  [place]   [facility] at [facility] [address] [place], [place] [address] [phone]   Medication Adjustments/Labs and Tests Ordered: Current medicines are reviewed at length with the patient today.  Concerns regarding medicines are outlined above.  Orders Placed This Encounter  Procedures   EKG 12-Lead   No orders of the defined types were placed in this encounter.   Patient Instructions  Medication Instructions:  Your  Physician recommend you continue on your current medication as directed.    *If you need a refill on your cardiac medications before your next appointment, please call your pharmacy*   Lab Work: None ordered today   Testing/Procedures: None ordered today   Follow-Up: At [facility], you and your health needs are our priority.  As part of our continuing mission to provide you with exceptional heart care, we have created designated Provider Care Teams.  These Care Teams include your primary Cardiologist (physician) and Advanced Practice Providers (APPs -  Physician Assistants and Nurse Practitioners) who all work together to provide you with the care you need, when you need it.  We recommend signing up for the patient portal called "MyChart".  Sign up information is provided on this After Visit Summary.  MyChart is used to connect with patients for Virtual Visits (Telemedicine).  Patients are able to view lab/test results, encounter notes, upcoming appointments, etc.  Non-urgent messages can be sent to your provider as well.   To learn more about what you can do with MyChart, go to [email or web address].    Your next appointment:   As needed   The format for your next appointment:   In Person  Provider:   Jodelle Red, MD   Fax: [phone]    Sherin Quarry as a scribe for Jodelle Red, MD.,have documented all relevant documentation on the behalf of Jodelle Red, MD,as directed by  Jodelle Red, MD while in the presence of Jodelle Red, MD.  I, Jodelle Red, MD, have reviewed all documentation for this visit. The documentation on 04/30/21 for the exam, diagnosis, procedures, and orders are all accurate and complete.   Signed, Jodelle Red, MD PhD 04/30/2021   [facility]

## 2021-04-30 NOTE — Patient Instructions (Addendum)
Medication Instructions:  Your Physician recommend you continue on your current medication as directed.    *If you need a refill on your cardiac medications before your next appointment, please call your pharmacy*   Lab Work: None ordered today   Testing/Procedures: None ordered today   Follow-Up: At [facility], you and your health needs are our priority.  As part of our continuing mission to provide you with exceptional heart care, we have created designated Provider Care Teams.  These Care Teams include your primary Cardiologist (physician) and Advanced Practice Providers (APPs -  Physician Assistants and Nurse Practitioners) who all work together to provide you with the care you need, when you need it.  We recommend signing up for the patient portal called "MyChart".  Sign up information is provided on this After Visit Summary.  MyChart is used to connect with patients for Virtual Visits (Telemedicine).  Patients are able to view lab/test results, encounter notes, upcoming appointments, etc.  Non-urgent messages can be sent to your provider as well.   To learn more about what you can do with MyChart, go to [email or web address].    Your next appointment:   As needed   The format for your next appointment:   In Person  Provider:   Jodelle Red, MD   Fax: [phone]

## 2021-06-05 DIAGNOSIS — H401124 Primary open-angle glaucoma, left eye, indeterminate stage: Secondary | ICD-10-CM | POA: Diagnosis not present

## 2021-06-05 DIAGNOSIS — J449 Chronic obstructive pulmonary disease, unspecified: Secondary | ICD-10-CM | POA: Diagnosis not present

## 2021-06-05 DIAGNOSIS — H04123 Dry eye syndrome of bilateral lacrimal glands: Secondary | ICD-10-CM | POA: Diagnosis not present

## 2021-06-05 DIAGNOSIS — K5090 Crohn's disease, unspecified, without complications: Secondary | ICD-10-CM | POA: Diagnosis not present

## 2021-06-05 DIAGNOSIS — H40051 Ocular hypertension, right eye: Secondary | ICD-10-CM | POA: Diagnosis not present

## 2021-06-17 DIAGNOSIS — M545 Low back pain, unspecified: Secondary | ICD-10-CM | POA: Diagnosis not present
